# Patient Record
Sex: Male | Born: 1960 | Race: White | Hispanic: No | Marital: Married | Smoking: Never smoker
Health system: Southern US, Community
[De-identification: ages and names within clinical notes are randomized; demographics above are authoritative.]

## PROBLEM LIST (undated history)

## (undated) DIAGNOSIS — E785 Hyperlipidemia, unspecified: Secondary | ICD-10-CM

## (undated) DIAGNOSIS — T7840XA Allergy, unspecified, initial encounter: Secondary | ICD-10-CM

## (undated) DIAGNOSIS — K649 Unspecified hemorrhoids: Secondary | ICD-10-CM

## (undated) DIAGNOSIS — K219 Gastro-esophageal reflux disease without esophagitis: Secondary | ICD-10-CM

## (undated) DIAGNOSIS — R569 Unspecified convulsions: Secondary | ICD-10-CM

## (undated) DIAGNOSIS — E349 Endocrine disorder, unspecified: Secondary | ICD-10-CM

## (undated) DIAGNOSIS — R112 Nausea with vomiting, unspecified: Secondary | ICD-10-CM

## (undated) DIAGNOSIS — Z9889 Other specified postprocedural states: Secondary | ICD-10-CM

## (undated) HISTORY — PX: MANDIBLE SURGERY: SHX707

## (undated) HISTORY — PX: VASECTOMY: SHX75

## (undated) HISTORY — PX: INGUINAL HERNIA REPAIR: SUR1180

## (undated) HISTORY — PX: BACK SURGERY: SHX140

## (undated) HISTORY — PX: COLONOSCOPY: SHX174

## (undated) HISTORY — DX: Gastro-esophageal reflux disease without esophagitis: K21.9

## (undated) HISTORY — DX: Other specified postprocedural states: Z98.890

## (undated) HISTORY — DX: Unspecified hemorrhoids: K64.9

## (undated) HISTORY — DX: Hyperlipidemia, unspecified: E78.5

## (undated) HISTORY — DX: Other specified postprocedural states: R11.2

## (undated) HISTORY — PX: UMBILICAL HERNIA REPAIR: SHX196

## (undated) HISTORY — DX: Unspecified convulsions: R56.9

## (undated) HISTORY — DX: Allergy, unspecified, initial encounter: T78.40XA

## (undated) HISTORY — PX: OTHER SURGICAL HISTORY: SHX169

## (undated) HISTORY — DX: Endocrine disorder, unspecified: E34.9

## (undated) HISTORY — PX: ANKLE SURGERY: SHX546

---

## 2019-12-19 ENCOUNTER — Other Ambulatory Visit: Payer: Self-pay

## 2019-12-20 ENCOUNTER — Ambulatory Visit (INDEPENDENT_AMBULATORY_CARE_PROVIDER_SITE_OTHER): Payer: PRIVATE HEALTH INSURANCE | Admitting: Family Medicine

## 2019-12-20 ENCOUNTER — Encounter: Payer: Self-pay | Admitting: Gastroenterology

## 2019-12-20 ENCOUNTER — Other Ambulatory Visit: Payer: Self-pay

## 2019-12-20 ENCOUNTER — Encounter: Payer: Self-pay | Admitting: Family Medicine

## 2019-12-20 VITALS — BP 110/68 | HR 87 | Temp 95.2°F | Ht 69.0 in | Wt 178.0 lb

## 2019-12-20 DIAGNOSIS — Z114 Encounter for screening for human immunodeficiency virus [HIV]: Secondary | ICD-10-CM

## 2019-12-20 DIAGNOSIS — E291 Testicular hypofunction: Secondary | ICD-10-CM | POA: Diagnosis not present

## 2019-12-20 DIAGNOSIS — E7800 Pure hypercholesterolemia, unspecified: Secondary | ICD-10-CM | POA: Diagnosis not present

## 2019-12-20 DIAGNOSIS — J302 Other seasonal allergic rhinitis: Secondary | ICD-10-CM | POA: Insufficient documentation

## 2019-12-20 DIAGNOSIS — Z1159 Encounter for screening for other viral diseases: Secondary | ICD-10-CM

## 2019-12-20 DIAGNOSIS — R569 Unspecified convulsions: Secondary | ICD-10-CM

## 2019-12-20 DIAGNOSIS — Z1211 Encounter for screening for malignant neoplasm of colon: Secondary | ICD-10-CM

## 2019-12-20 DIAGNOSIS — M25561 Pain in right knee: Secondary | ICD-10-CM

## 2019-12-20 LAB — COMPREHENSIVE METABOLIC PANEL
ALT: 15 U/L (ref 0–53)
AST: 14 U/L (ref 0–37)
Albumin: 4.6 g/dL (ref 3.5–5.2)
Alkaline Phosphatase: 52 U/L (ref 39–117)
BUN: 16 mg/dL (ref 6–23)
CO2: 24 mEq/L (ref 19–32)
Calcium: 9.3 mg/dL (ref 8.4–10.5)
Chloride: 109 mEq/L (ref 96–112)
Creatinine, Ser: 1.37 mg/dL (ref 0.40–1.50)
GFR: 52.98 mL/min — ABNORMAL LOW (ref >60.00)
Glucose, Bld: 98 mg/dL (ref 70–99)
Potassium: 4.1 mEq/L (ref 3.5–5.1)
Sodium: 140 mEq/L (ref 135–145)
Total Bilirubin: 0.5 mg/dL (ref 0.2–1.2)
Total Protein: 6.3 g/dL (ref 6.0–8.3)

## 2019-12-20 LAB — LIPID PANEL
Cholesterol: 199 mg/dL (ref 0–200)
HDL: 39.9 mg/dL (ref >39.00)
LDL Cholesterol: 140 mg/dL — ABNORMAL HIGH (ref 0–99)
NonHDL: 158.63
Total CHOL/HDL Ratio: 5
Triglycerides: 91 mg/dL (ref 0.0–149.0)
VLDL: 18.2 mg/dL (ref 0.0–40.0)

## 2019-12-20 MED ORDER — CLOTRIMAZOLE-BETAMETHASONE 1-0.05 % EX CREA: 1.0000 "application " | TOPICAL_CREAM | Freq: Two times a day (BID) | CUTANEOUS | 2 refills | Status: DC

## 2019-12-20 MED ORDER — SIMVASTATIN 20 MG PO TABS: 20.0000 mg | ORAL_TABLET | Freq: Every day | ORAL | 3 refills | Status: DC

## 2019-12-20 MED ORDER — MONTELUKAST SODIUM 10 MG PO TABS: 10.0000 mg | ORAL_TABLET | Freq: Every day | ORAL | 3 refills | Status: DC

## 2019-12-20 NOTE — Patient Instructions (Addendum)
If you do not hear anything about your referrals in the next 1-2 weeks, call our office and ask for an update.  Give [place] 2-3 business days to get the results of your labs back.   Keep the diet clean and stay active.  Let us know if you need anything.  OK to run and hike. Give pickle ball a few more weeks.  Knee Exercises It is normal to feel mild stretching, pulling, tightness, or discomfort as you do these exercises, but you should stop right away if you feel sudden pain or your pain gets worse. STRETCHING AND RANGE OF MOTION EXERCISES  These exercises warm up your muscles and joints and improve the movement and flexibility of your knee. These exercises also help to relieve pain, numbness, and tingling. Exercise A: Knee Extension, Prone  1. Lie on your abdomen on a bed. 2. Place your left / right knee just beyond the edge of the surface so your knee is not on the bed. You can put a towel under your left / right thigh just above your knee for comfort. 3. Relax your leg muscles and allow gravity to straighten your knee. You should feel a stretch behind your left / right knee. 4. Hold this position for 30 seconds. 5. Scoot up so your knee is supported between repetitions. Repeat 2 times. Complete this stretch 3 times per week. Exercise B: Knee Flexion, Active    1. Lie on your back with both knees straight. If this causes back discomfort, bend your left / right knee so your foot is flat on the floor. 2. Slowly slide your left / right heel back toward your buttocks until you feel a gentle stretch in the front of your knee or thigh. 3. Hold this position for 30 seconds. 4. Slowly slide your left / right heel back to the starting position. Repeat 2 times. Complete this exercise 3 times per week. Exercise C: Quadriceps, Prone    1. Lie on your abdomen on a firm surface, such as a bed or padded floor. 2. Bend your left / right knee and hold your ankle. If you cannot reach your ankle or  pant leg, loop a belt around your foot and grab the belt instead. 3. Gently pull your heel toward your buttocks. Your knee should not slide out to the side. You should feel a stretch in the front of your thigh and knee. 4. Hold this position for 30 seconds. Repeat 2 times. Complete this stretch 3 times per week. Exercise D: Hamstring, Supine  1. Lie on your back. 2. Loop a belt or towel over the ball of your left / right foot. The ball of your foot is on the walking surface, right under your toes. 3. Straighten your left / right knee and slowly pull on the belt to raise your leg until you feel a gentle stretch behind your knee. ? Do not let your left / right knee bend while you do this. ? Keep your other leg flat on the floor. 4. Hold this position for 30 seconds. Repeat 2 times. Complete this stretch 3 times per week. STRENGTHENING EXERCISES  These exercises build strength and endurance in your knee. Endurance is the ability to use your muscles for a long time, even after they get tired. Exercise E: Quadriceps, Isometric    1. Lie on your back with your left / right leg extended and your other knee bent. Put a rolled towel or small pillow under your knee  if told by your health care provider. 2. Slowly tense the muscles in the front of your left / right thigh. You should see your kneecap slide up toward your hip or see increased dimpling just above the knee. This motion will push the back of the knee toward the floor. 3. For 3 seconds, keep the muscle as tight as you can without increasing your pain. 4. Relax the muscles slowly and completely. Repeat for 10 total reps Repeat 2 ti mes. Complete this exercise 3 times per week. Exercise F: Straight Leg Raises - Quadriceps  1. Lie on your back with your left / right leg extended and your other knee bent. 2. Tense the muscles in the front of your left / right thigh. You should see your kneecap slide up or see increased dimpling just above the  knee. Your thigh may even shake a bit. 3. Keep these muscles tight as you raise your leg 4-6 inches (10-15 cm) off the floor. Do not let your knee bend. 4. Hold this position for 3 seconds. 5. Keep these muscles tense as you lower your leg. 6. Relax your muscles slowly and completely after each repetition. 10 total reps. Repeat 2 times. Complete this exercise 3 times per week.  Exercise G: Hamstring Curls    If told by your health care provider, do this exercise while wearing ankle weights. Begin with 5 lb weights (optional). Then increase the weight by 1 lb (0.5 kg) increments. Do not wear ankle weights that are more than 20 lbs to start with. 1. Lie on your abdomen with your legs straight. 2. Bend your left / right knee as far as you can without feeling pain. Keep your hips flat against the floor. 3. Hold this position for 3 seconds. 4. Slowly lower your leg to the starting position. Repeat for 10 reps.  Repeat 2 times. Complete this exercise 3 times per week. Exercise H: Squats (Quadriceps)  1. Stand in front of a table, with your feet and knees pointing straight ahead. You may rest your hands on the table for balance but not for support. 2. Slowly bend your knees and lower your hips like you are going to sit in a chair. ? Keep your weight over your heels, not over your toes. ? Keep your lower legs upright so they are parallel with the table legs. ? Do not let your hips go lower than your knees. ? Do not bend lower than told by your health care provider. ? If your knee pain increases, do not bend as low. 3. Hold the squat position for 1 second. 4. Slowly push with your legs to return to standing. Do not use your hands to pull yourself to standing. Repeat 2 times. Complete this exercise 3 times per week. Exercise I: Wall Slides (Quadriceps)    1. Lean your back against a smooth wall or door while you walk your feet out 18-24 inches (46-61 cm) from it. 2. Place your feet hip-width  apart. 3. Slowly slide down the wall or door until your knees Repeat 2 times. Complete this exercise every other day. 4. Exercise K: Straight Leg Raises - Hip Abductors  1. Lie on your side with your left / right leg in the top position. Lie so your head, shoulder, knee, and hip line up. You may bend your bottom knee to help you keep your balance. 2. Roll your hips slightly forward so your hips are stacked directly over each other and your left /  right knee is facing forward. 3. Leading with your heel, lift your top leg 4-6 inches (10-15 cm). You should feel the muscles in your outer hip lifting. ? Do not let your foot drift forward. ? Do not let your knee roll toward the ceiling. 4. Hold this position for 3 seconds. 5. Slowly return your leg to the starting position. 6. Let your muscles relax completely after each repetition. 10 total reps. Repeat 2 times. Complete this exercise 3 times per week. Exercise J: Straight Leg Raises - Hip Extensors  1. Lie on your abdomen on a firm surface. You can put a pillow under your hips if that is more comfortable. 2. Tense the muscles in your buttocks and lift your left / right leg about 4-6 inches (10-15 cm). Keep your knee straight as you lift your leg. 3. Hold this position for 3 seconds. 4. Slowly lower your leg to the starting position. 5. Let your leg relax completely after each repetition. Repeat 2 times. Complete this exercise 3 times per week. Document Released: 07/22/2005 Document Revised: 06/01/2016 Document Reviewed: 07/14/2015 Elsevier Interactive Patient Education  2017 [employer].

## 2019-12-20 NOTE — Progress Notes (Signed)
Chief Complaint  Patient presents with  . New Patient (Initial Visit)       New Patient Visit SUBJECTIVE: HPI: Stanley Freeman is an 59 y.o.male who is being seen for establishing care.  The patient was previously seen in [place].  Patient has a history of seizures.  He had 2 grand mal seizures when he was younger.  He has had a series of petit mall seizures in the decades following.  He has not had a seizure in the past 15 years.  His neurologist in [place] suggested he try coming off the medication but he would prefer to wait until he is retired to do this.  He is currently taking Lamictal and Topamax.  He is compliant with his medication and tolerating well.  Patient has a history of hypergonadism.  He has been on testosterone replacement for the past 10 years.  He was under the care of a urologist and was requesting a referral to one.  The patient has a history of dyslipidemia.  He does have a family history in both of his parents for this.  He is taking Zocor 20 mg daily.  He is tolerating this well and is not having any side effects.  Diet is healthy overall.  Exercise includes hiking, walking, pickleball, and cycling.  Patient hurt his right knee almost 7 weeks ago while playing pickle ball.  He denies hearing any snaps or pops.  No swelling, redness, or bruising.  The knee does not catch or lock.  It does not feel unsteady.  Past Medical History:  Diagnosis Date  . Allergy   . Hyperlipidemia   . Seizures ([place])   . Testosterone deficiency    History reviewed. No pertinent surgical history. Family History  Problem Relation Age of Onset  . Dementia Father    No Known Allergies  Current Outpatient Medications:  .  cetirizine (ZYRTEC) 10 MG tablet, Take 10 mg by mouth daily., Disp: , Rfl:  .  fluticasone (FLONASE) 50 MCG/ACT nasal spray, Place into both nostrils daily., Disp: , Rfl:  .  lamoTRIgine (LAMICTAL) 200 MG tablet, Take 200 mg by mouth 2 (two) times daily., Disp: , Rfl:  .   montelukast (SINGULAIR) 10 MG tablet, Take 1 tablet (10 mg total) by mouth at bedtime., Disp: 90 tablet, Rfl: 3 .  simvastatin (ZOCOR) 20 MG tablet, Take 1 tablet (20 mg total) by mouth daily., Disp: 90 tablet, Rfl: 3 .  topiramate (TOPAMAX) 100 MG tablet, Take 100 mg by mouth 2 (two) times daily., Disp: , Rfl:  .  clotrimazole-betamethasone (LOTRISONE) cream, Apply 1 application topically 2 (two) times daily., Disp: 30 g, Rfl: 2   OBJECTIVE: BP 110/68 (BP Location: Left Arm, Patient Position: Sitting, Cuff Size: Normal)   Pulse 87   Temp (!) 95.2 F (35.1 C) (Temporal)   Ht 5\' 9"  (1.753 m)   Wt 178 lb (80.7 kg)   SpO2 97%   BMI 26.29 kg/m  General:  well developed, well nourished, in no apparent distress Skin:  no significant moles, warts, or growths Throat/Pharynx:  lips and gingiva without lesion; tongue and uvula midline; non-inflamed pharynx; no exudates or postnasal drainage Lungs:  clear to auscultation, breath sounds equal bilaterally, no respiratory distress Cardio:  regular rate and rhythm, no LE edema or bruits Musculoskeletal: Right knee-mild joint line tenderness medially; no other joint line tenderness or bony tenderness.  Negative patellar apprehension/grind, Lachman's, varus/valgus stress, McMurray's, and Apley grind Neuro:  gait normal Psych: well oriented  with normal range of affect and appropriate judgment/insight  ASSESSMENT/PLAN: Pure hypercholesterolemia - Plan: simvastatin (ZOCOR) 20 MG tablet, Lipid panel, Comprehensive metabolic panel  Hypogonadism male - Plan: Ambulatory referral to Urology  Seasonal allergies - Plan: cetirizine (ZYRTEC) 10 MG tablet, fluticasone (FLONASE) 50 MCG/ACT nasal spray, montelukast (SINGULAIR) 10 MG tablet  Seizures ([facility]) - Plan: topiramate (TOPAMAX) 100 MG tablet, lamoTRIgine (LAMICTAL) 200 MG tablet, Ambulatory referral to Neurology  Screen for colon cancer - Plan: Ambulatory referral to Gastroenterology  Screening for HIV  (human immunodeficiency virus) - Plan: HIV Antibody (routine testing w rflx)  Acute pain of right knee  Encounter for hepatitis C screening test for low risk patient - Plan: Hepatitis C antibody  1-check labs.  Counseled on diet and exercise.  With him, likely more genetic as opposed to poor lifestyle choices. 2-refer to urology 3-continue Zyrtec, Flonase, and Singulair. 4-continue Topamax and Lamictal.  Refer to neurology. 5-due for colon cancer screening, last was 10 years ago. 6-screening today 7-stretches and exercises given.  I doubt he tore his MCL given normal valgus stress response.  If he did injure his meniscus, I think it is mild given his testing.  Activity as tolerated.  Would hold off on pickleball or any activity that involves planting or shifting weight rapidly for the next few weeks. 8-screening today Patient should return 6 months for a physical or as needed. The patient voiced understanding and agreement to the plan.   [place], DO 12/20/19  8:24 AM

## 2019-12-21 LAB — HEPATITIS C ANTIBODY
Hepatitis C Ab: NONREACTIVE
SIGNAL TO CUT-OFF: 0.01 (ref <1.00)

## 2019-12-21 LAB — HIV ANTIBODY (ROUTINE TESTING W REFLEX): HIV 1&2 Ab, 4th Generation: NONREACTIVE

## 2019-12-25 ENCOUNTER — Other Ambulatory Visit: Payer: Self-pay | Admitting: Family Medicine

## 2019-12-25 DIAGNOSIS — E7800 Pure hypercholesterolemia, unspecified: Secondary | ICD-10-CM

## 2019-12-25 MED ORDER — ROSUVASTATIN CALCIUM 20 MG PO TABS: 20.0000 mg | ORAL_TABLET | Freq: Every day | ORAL | 3 refills | Status: DC

## 2019-12-25 NOTE — Progress Notes (Signed)
lipi

## 2019-12-27 ENCOUNTER — Telehealth: Payer: Self-pay | Admitting: Neurology

## 2019-12-27 NOTE — Telephone Encounter (Signed)
I reviewed his neurological clinic evaluation from [facility] dated December 26, 2018 by Dr. 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

## 2020-01-19 ENCOUNTER — Other Ambulatory Visit: Payer: Self-pay

## 2020-01-19 ENCOUNTER — Ambulatory Visit (AMBULATORY_SURGERY_CENTER): Payer: Self-pay | Admitting: *Deleted

## 2020-01-19 VITALS — Temp 96.8°F | Ht 69.0 in | Wt 180.0 lb

## 2020-01-19 DIAGNOSIS — Z1211 Encounter for screening for malignant neoplasm of colon: Secondary | ICD-10-CM

## 2020-01-19 NOTE — Progress Notes (Signed)
COMPLETED COVID VACCINES 12-25-19   No egg or soy allergy known to patient  Nissues with past sedation with any surgeries  or procedures OF PONV , no intubation problems  No diet pills per patient No home 02 use per patient  No blood thinners per patient  Pt denies issues with constipation  No A fib or A flutter  EMMI video sent to pt's e mail  PER PT LAST SEIZURE 2005- ON LAMICTAL AND TOPAMAX   Due to the COVID-19 pandemic we are asking patients to follow these guidelines. Please only bring one care partner. Please be aware that your care partner may wait in the car in the parking lot or if they feel like they will be too hot to wait in the car, they may wait in the lobby on the 4th floor. All care partners are required to wear a mask the entire time (we do not have any that we can provide them), they need to practice social distancing, and we will do a Covid check for all patient's and care partners when you arrive. Also we will check their temperature and your temperature. If the care partner waits in their car they need to stay in the parking lot the entire time and we will call them on their cell phone when the patient is ready for discharge so they can bring the car to the front of the building. Also all patient's will need to wear a mask into building.

## 2020-01-29 ENCOUNTER — Ambulatory Visit (INDEPENDENT_AMBULATORY_CARE_PROVIDER_SITE_OTHER): Payer: PRIVATE HEALTH INSURANCE | Admitting: Neurology

## 2020-01-29 ENCOUNTER — Encounter: Payer: Self-pay | Admitting: Neurology

## 2020-01-29 ENCOUNTER — Other Ambulatory Visit: Payer: Self-pay

## 2020-01-29 VITALS — BP 132/82 | HR 66 | Ht 69.0 in | Wt 177.0 lb

## 2020-01-29 DIAGNOSIS — R569 Unspecified convulsions: Secondary | ICD-10-CM | POA: Diagnosis not present

## 2020-01-29 DIAGNOSIS — G40301 Generalized idiopathic epilepsy and epileptic syndromes, not intractable, with status epilepticus: Secondary | ICD-10-CM

## 2020-01-29 MED ORDER — TOPIRAMATE 100 MG PO TABS: 100.0000 mg | ORAL_TABLET | Freq: Two times a day (BID) | ORAL | 4 refills | Status: DC

## 2020-01-29 MED ORDER — LAMOTRIGINE 200 MG PO TABS: 200.0000 mg | ORAL_TABLET | Freq: Two times a day (BID) | ORAL | 4 refills | Status: DC

## 2020-01-29 NOTE — Progress Notes (Signed)
PATIENT: Stanley Freeman DOB: 08-24-1961  Chief Complaint  Patient presents with  . New Patient (Initial Visit)    PCP/Referring: Dr. Riki Sheer. Vision: 20/40 with correction bilaterally.  . Seizures    Grand mal seizures in his 30s. Some petite seizures in his 83s and 67s. Doing well on lamictal and topamax. Will need refills.     HISTORICAL  Stanley Freeman is a 59 year old male, seen in request by his primary care physician Dr. Nani Ravens, [place], for evaluation of seizure, initial evaluation was on Jan 29, 2020.  I have reviewed and summarized the referring note from the referring physician.  He had a past medical history of hypertension, hyperlipidemia, reported history of seizures since 1984.  He suffered his first nocturnal generalized seizure at age 50 in 34, had his second witnessed nocturnal seizure in 1985, reported normal MRI of the brain, EEG at that time, he was treated with Dilantin, since Dilantin treatment, he no longer have generalized seizure  However, he continue complains of intermittent morning time dj vu, he remembered while he was shaving, he has this waves of weird sensation throughout his body, last less than 1 minute, mild confusion afterwards, there was no tonic-clonic shaking, he was followed by his primary care physician during that period of time, around his 30s, stayed on Dilantin.  He had recurrent partial seizure, about once or twice every months.  He began to seek attention from neurologist after he settled in [place] in 2001, was seen by a neurologist Dr. 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  Laboratory evaluation April 2021, normal CMP, lipid panel, LDL 140, negative HIV, hepatitis C   REVIEW OF SYSTEMS: Full 14 system review of systems performed and notable only for as above All other review of systems were negative.  ALLERGIES: No Known Allergies  HOME MEDICATIONS: Current Outpatient Medications  Medication Sig Dispense Refill  . cetirizine (ZYRTEC) 10 MG tablet Take 10 mg by mouth daily.    . clotrimazole-betamethasone (LOTRISONE) cream Apply 1 application topically 2 (two) times daily. 30 g 2  . fluticasone (FLONASE) 50 MCG/ACT nasal spray Place into both nostrils daily.    Marland Kitchen lamoTRIgine (LAMICTAL) 200 MG tablet Take 200 mg by mouth 2 (two) times daily.    . montelukast (SINGULAIR) 10 MG tablet Take 1 tablet (10 mg total) by mouth at bedtime. 90 tablet 3  . rosuvastatin (CRESTOR) 20 MG tablet Take 1 tablet (20 mg total) by mouth daily. 30 tablet 3  . testosterone (ANDROGEL) 50 MG/5GM (1%) GEL Place 5 g onto the skin daily. 3 PUMPS A DAY    . topiramate (TOPAMAX) 100 MG tablet Take 100 mg by mouth 2 (two) times daily.     No current facility-administered medications for this visit.    PAST MEDICAL HISTORY: Past Medical History:  Diagnosis Date  . Allergy   .  GERD (gastroesophageal reflux disease)    SEV YRS AGO- OCC HEARTBURM- PRN OTC USE   . Hemorrhoids   . Hyperlipidemia   . PONV (postoperative nausea and vomiting)   . Seizures ([place])    ON TOPAMAX AND LAMICTAL- LAST 2005 SEIZURES- GRAND MALX 2 IN EARLY 20'S   . Testosterone deficiency     PAST SURGICAL HISTORY: Past Surgical History:  Procedure Laterality Date  . ANKLE SURGERY    . BACK SURGERY       DISC  . COLONOSCOPY     IN [place] 10 YRS AGO- NORMAL PER PT   . HEMORROID BANDING     X 2 WITH HELP PER PT   . INGUINAL HERNIA REPAIR    . MANDIBLE SURGERY    . UMBILICAL HERNIA REPAIR    . VASECTOMY      FAMILY HISTORY: Family History  Problem Relation Age of Onset  . Diverticulitis Mother   . Colon polyps Mother   . Dementia Father   . Colon cancer Neg Hx   . Esophageal cancer Neg Hx   . Rectal cancer Neg Hx   . Stomach cancer Neg Hx     SOCIAL HISTORY: Social History   Socioeconomic History  . Marital status: Married    Spouse name: Not on file  . Number of children: Not on file  . Years of education: Not on file  . Highest education level: Not on file  Occupational History  . Not on file  Tobacco Use  . Smoking status: Never Smoker  . Smokeless tobacco: Never Used  Substance and Sexual Activity  . Alcohol use: Yes    Comment: SOCIAL 1-2 DRINKS A FEW DAYS A WEEK   . Drug use: Never  . Sexual activity: Not on file  Other Topics Concern  . Not on file  Social History Narrative  . Not on file   Social Determinants of Health   Financial Resource Strain:   . Difficulty of Paying Living Expenses:   Food Insecurity:   . Worried About Charity fundraiser in the Last Year:   . Arboriculturist in the Last Year:   Transportation Needs:   . Film/video editor (Medical):   Marland Kitchen Lack of Transportation (Non-Medical):   Physical Activity:   . Days of Exercise per Week:   . Minutes of Exercise per Session:   Stress:   . Feeling of Stress :   Social Connections:   . Frequency of Communication with Friends and Family:   . Frequency of Social Gatherings with Friends and Family:   . Attends Religious Services:   . Active Member of Clubs or Organizations:   . Attends Archivist Meetings:   Marland Kitchen Marital Status:   Intimate Partner Violence:   . Fear of Current or Ex-Partner:   . Emotionally Abused:   Marland Kitchen Physically Abused:   . Sexually Abused:       PHYSICAL EXAM   Vitals:   01/29/20 1512  BP: 132/82  Pulse: 66  Weight: 177 lb (80.3 kg)  Height: 5\' 9"  (1.753 m)    Not recorded      Body mass index is 26.14 kg/m.  PHYSICAL EXAMNIATION:  Gen: NAD, conversant, well nourised, well groomed                     Cardiovascular: Regular rate rhythm, no peripheral edema, warm, nontender. Eyes: Conjunctivae clear without exudates or hemorrhage Neck: Supple, no  carotid bruits. Pulmonary: Clear to auscultation bilaterally   NEUROLOGICAL EXAM:  MENTAL STATUS: Speech:    Speech is normal; fluent and spontaneous with normal comprehension.  Cognition:     Orientation to time, place and person     Normal recent and remote memory     Normal Attention span and concentration     Normal Language, naming, repeating,spontaneous speech     Fund of knowledge   CRANIAL NERVES: CN II: Visual fields are full to confrontation. Pupils are round equal and briskly reactive to light. CN III, IV, VI: extraocular movement are normal. No ptosis. CN V: Facial sensation is intact to light touch CN VII: Face is symmetric with normal eye closure  CN VIII: Hearing is normal to causal conversation. CN IX, X: Phonation is normal. CN XI: Head turning and shoulder shrug are intact  MOTOR: There is no pronator drift of out-stretched arms. Muscle bulk and tone are normal. Muscle strength is normal.  REFLEXES: Reflexes are 2+ and symmetric at the biceps, triceps, knees, and ankles. Plantar responses are flexor.  SENSORY: Intact to light touch, pinprick and vibratory sensation are intact in fingers and toes.  COORDINATION: There is no trunk or limb dysmetria noted.  GAIT/STANCE: Posture is normal. Gait is steady with normal steps, base, arm swing, and turning. Heel and toe walking are normal. Tandem gait is normal.  Romberg is absent.   DIAGNOSTIC DATA (LABS, IMAGING, TESTING) - I reviewed patient records, labs, notes, testing and imaging  myself where available.   ASSESSMENT AND PLAN  Stanley Freeman is a 59 y.o. male   Complex partial seizure  Reported normal MRI of the brain, EEG  Refill his lamotrigine 200 twice a day, Topamax 100 mg twice a day   Marcial Pacas, M.D. Ph.D.  [facility] [address] [place], [place] reviewed his neurological clinic evaluation from [facility] dated December 26, 2018 by Dr. 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

## 2020-01-30 ENCOUNTER — Encounter: Payer: Self-pay | Admitting: Gastroenterology

## 2020-02-02 ENCOUNTER — Ambulatory Visit (AMBULATORY_SURGERY_CENTER): Payer: PRIVATE HEALTH INSURANCE | Admitting: Gastroenterology

## 2020-02-02 ENCOUNTER — Other Ambulatory Visit: Payer: Self-pay

## 2020-02-02 ENCOUNTER — Encounter: Payer: Self-pay | Admitting: Gastroenterology

## 2020-02-02 VITALS — BP 101/69 | HR 68 | Temp 97.3°F | Resp 15 | Ht 69.0 in | Wt 180.0 lb

## 2020-02-02 DIAGNOSIS — Z1211 Encounter for screening for malignant neoplasm of colon: Secondary | ICD-10-CM

## 2020-02-02 DIAGNOSIS — K5730 Diverticulosis of large intestine without perforation or abscess without bleeding: Secondary | ICD-10-CM

## 2020-02-02 DIAGNOSIS — D124 Benign neoplasm of descending colon: Secondary | ICD-10-CM

## 2020-02-02 DIAGNOSIS — K641 Second degree hemorrhoids: Secondary | ICD-10-CM

## 2020-02-02 MED ORDER — SODIUM CHLORIDE 0.9 % IV SOLN: 500.0000 mL | INTRAVENOUS | Status: DC

## 2020-02-02 NOTE — Patient Instructions (Signed)
YOU HAD AN ENDOSCOPIC PROCEDURE TODAY AT [facility]:   Refer to the procedure report that was given to you for any specific questions about what was found during the examination.  If the procedure report does not answer your questions, please call your gastroenterologist to clarify.  If you requested that your care partner not be given the details of your procedure findings, then the procedure report has been included in a sealed envelope for you to review at your convenience later.  YOU SHOULD EXPECT: Some feelings of bloating in the abdomen. Passage of more gas than usual.  Walking can help get rid of the air that was put into your GI tract during the procedure and reduce the bloating. If you had a lower endoscopy (such as a colonoscopy or flexible sigmoidoscopy) you may notice spotting of blood in your stool or on the toilet paper. If you underwent a bowel prep for your procedure, you may not have a normal bowel movement for a few days.  Please Note:  You might notice some irritation and congestion in your nose or some drainage.  This is from the oxygen used during your procedure.  There is no need for concern and it should clear up in a day or so.  SYMPTOMS TO REPORT IMMEDIATELY:   Following lower endoscopy (colonoscopy or flexible sigmoidoscopy):  Excessive amounts of blood in the stool  Significant tenderness or worsening of abdominal pains  Swelling of the abdomen that is new, acute  Fever of 100F or higher    For urgent or emergent issues, a gastroenterologist can be reached at any hour by calling [phone]. Do not use MyChart messaging for urgent concerns.    DIET:  We do recommend a small meal at first, but then you may proceed to your regular diet.  Drink plenty of fluids but you should avoid alcoholic beverages for 24 hours.  ACTIVITY:  You should plan to take it easy for the rest of today and you should NOT DRIVE or use heavy machinery until tomorrow  (because of the sedation medicines used during the test).    FOLLOW UP: Our staff will call the number listed on your records 48-72 hours following your procedure to check on you and address any questions or concerns that you may have regarding the information given to you following your procedure. If we do not reach you, we will leave a message.  We will attempt to reach you two times.  During this call, we will ask if you have developed any symptoms of COVID 19. If you develop any symptoms (ie: fever, flu-like symptoms, shortness of breath, cough etc.) before then, please call [phone].  If you test positive for Covid 19 in the 2 weeks post procedure, please call and report this information to [place].    If any biopsies were taken you will be contacted by phone or by letter within the next 1-3 weeks.  Please call us at [phone] if you have not heard about the biopsies in 3 weeks.    SIGNATURES/CONFIDENTIALITY: You and/or your care partner have signed paperwork which will be entered into your electronic medical record.  These signatures attest to the fact that that the information above on your After Visit Summary has been reviewed and is understood.  Full responsibility of the confidentiality of this discharge information lies with you and/or your care-partner.   Resume medications,see report for fiber recommendations. Information given on polyps,diverticulosis and hemorrhoids.

## 2020-02-02 NOTE — Op Note (Addendum)
[facility] Patient Name: Stanley Freeman Procedure Date: 02/02/2020 8:58 AM MRN: [id number] Endoscopist: Gerrit Heck , MD Age: 59 Referring MD:  Date of Birth: October 02, 1960 Gender: Male Account #: [id number] Procedure:                Colonoscopy Indications:              Screening for colorectal malignant neoplasm (last                            colonoscopy was 10 years ago) Medicines:                Monitored Anesthesia Care Procedure:                Pre-Anesthesia Assessment:                           - Prior to the procedure, a History and Physical                            was performed, and patient medications and                            allergies were reviewed. The patient's tolerance of                            previous anesthesia was also reviewed. The risks                            and benefits of the procedure and the sedation                            options and risks were discussed with the patient.                            All questions were answered, and informed consent                            was obtained. Prior Anticoagulants: The patient has                            taken no previous anticoagulant or antiplatelet                            agents. ASA Grade Assessment: II - A patient with                            mild systemic disease. After reviewing the risks                            and benefits, the patient was deemed in                            satisfactory condition to undergo the procedure.  After obtaining informed consent, the colonoscope                            was passed under direct vision. Throughout the                            procedure, the patient's blood pressure, pulse, and                            oxygen saturations were monitored continuously. The                            Colonoscope was introduced through the anus and                            advanced to the the terminal  ileum. The colonoscopy                            was performed without difficulty. The patient                            tolerated the procedure well. The quality of the                            bowel preparation was excellent. The terminal                            ileum, ileocecal valve, appendiceal orifice, and                            rectum were photographed. Scope In: 9:15:01 AM Scope Out: 9:33:59 AM Scope Withdrawal Time: 0 hours 13 minutes 27 seconds  Total Procedure Duration: 0 hours 18 minutes 58 seconds  Findings:                 Hemorrhoids were found on perianal exam.                           A 5 mm polyp was found in the descending colon. The                            polyp was sessile. The polyp was removed with a                            cold snare. Resection and retrieval were complete.                            Estimated blood loss was minimal.                           Multiple small-mouthed diverticula were found in                            the sigmoid colon.  Non-bleeding internal hemorrhoids were found during                            retroflexion. The hemorrhoids were medium-sized and                            Grade II (internal hemorrhoids that prolapse but                            reduce spontaneously).                           Three well healed scars consistent with previous                            hemorrhoid surgery/banding were found in the distal                            rectum. The scar tissue was healthy in appearance.                           The terminal ileum appeared normal. Complications:            No immediate complications. Estimated Blood Loss:     Estimated blood loss was minimal. Impression:               - Hemorrhoids found on perianal exam.                           - One 5 mm polyp in the descending colon, removed                            with a cold snare. Resected and retrieved.                            - Diverticulosis in the sigmoid colon.                           - Non-bleeding internal hemorrhoids.                           - The examined portion of the ileum was normal. Recommendation:           - Patient has a contact number available for                            emergencies. The signs and symptoms of potential                            delayed complications were discussed with the                            patient. Return to normal activities tomorrow.                            Written discharge instructions  were provided to the                            patient.                           - Resume previous diet.                           - Continue present medications.                           - Await pathology results.                           - Repeat colonoscopy in 5-10 years for surveillance                            based on pathology results.                           - Use fiber, for example Citrucel, Fibercon, Konsyl                            or Metamucil.                           - Return to GI clinic PRN. Gerrit Heck, MD 02/02/2020 9:41:18 AM

## 2020-02-02 NOTE — Progress Notes (Signed)
Report to PACU, RN, vss, BBS= Clear.  

## 2020-02-02 NOTE — Progress Notes (Signed)
Temp JB V/a CW  I have reviewed the patient's medical history in detail and updated the computerized patient record.

## 2020-02-02 NOTE — Progress Notes (Signed)
Called to room to assist during endoscopic procedure.  Patient ID and intended procedure confirmed with present staff. Received instructions for my participation in the procedure from the performing physician.  

## 2020-02-05 ENCOUNTER — Other Ambulatory Visit: Payer: Self-pay

## 2020-02-05 ENCOUNTER — Other Ambulatory Visit (INDEPENDENT_AMBULATORY_CARE_PROVIDER_SITE_OTHER): Payer: PRIVATE HEALTH INSURANCE

## 2020-02-05 DIAGNOSIS — E7800 Pure hypercholesterolemia, unspecified: Secondary | ICD-10-CM | POA: Diagnosis not present

## 2020-02-05 LAB — LIPID PANEL
Cholesterol: 164 mg/dL (ref 0–200)
HDL: 36.3 mg/dL — ABNORMAL LOW (ref >39.00)
LDL Cholesterol: 104 mg/dL — ABNORMAL HIGH (ref 0–99)
NonHDL: 127.87
Total CHOL/HDL Ratio: 5
Triglycerides: 121 mg/dL (ref 0.0–149.0)
VLDL: 24.2 mg/dL (ref 0.0–40.0)

## 2020-02-06 ENCOUNTER — Telehealth: Payer: Self-pay

## 2020-02-06 NOTE — Telephone Encounter (Signed)
  Follow up Call-  Call back number 02/02/2020  Post procedure Call Back phone  # [phone]  Permission to leave phone message Yes     Patient questions:  Do you have a fever, pain , or abdominal swelling? No. Pain Score  0 *  Have you tolerated food without any problems? Yes.    Have you been able to return to your normal activities? Yes.    Do you have any questions about your discharge instructions: Diet   No. Medications  No. Follow up visit  No.  Do you have questions or concerns about your Care? No.  Actions: * If pain score is 4 or above: No action needed, pain <4.  1. Have you developed a fever since your procedure? no  2.   Have you had an respiratory symptoms (SOB or cough) since your procedure? no  3.   Have you tested positive for COVID 19 since your procedure no  4.   Have you had any family members/close contacts diagnosed with the COVID 19 since your procedure?  no   If yes to any of these questions please route to Joylene John, RN and Erenest Rasher, RN

## 2020-02-06 NOTE — Telephone Encounter (Signed)
1st follow up call made.  NALM 

## 2020-02-07 MED ORDER — ROSUVASTATIN CALCIUM 20 MG PO TABS: 20.0000 mg | ORAL_TABLET | Freq: Every day | ORAL | 2 refills | Status: DC

## 2020-02-08 ENCOUNTER — Encounter: Payer: Self-pay | Admitting: Hematology & Oncology

## 2020-03-22 ENCOUNTER — Encounter: Payer: Self-pay | Admitting: Family Medicine

## 2020-03-22 ENCOUNTER — Other Ambulatory Visit: Payer: Self-pay

## 2020-03-22 ENCOUNTER — Ambulatory Visit (INDEPENDENT_AMBULATORY_CARE_PROVIDER_SITE_OTHER): Payer: PRIVATE HEALTH INSURANCE | Admitting: Family Medicine

## 2020-03-22 VITALS — BP 104/68 | HR 90 | Temp 98.1°F | Ht 69.0 in | Wt 172.5 lb

## 2020-03-22 DIAGNOSIS — M25561 Pain in right knee: Secondary | ICD-10-CM | POA: Diagnosis not present

## 2020-03-22 DIAGNOSIS — G8929 Other chronic pain: Secondary | ICD-10-CM | POA: Diagnosis not present

## 2020-03-22 NOTE — Progress Notes (Signed)
Musculoskeletal Exam  Patient: Stanley Freeman DOB: 09/26/1960  DOS: 03/22/2020  SUBJECTIVE:  Chief Complaint:   Chief Complaint  Patient presents with   Knee Pain    right    Stanley Freeman is a 59 y.o.  male for evaluation and treatment of R knee pain.   Onset:  5 months ago. Was getting better and with reintroduction of activity, he started having treturn of pain. Location: Medial knee  Character:  throbbing  Progression of issue:  Was getting better and is worsening Associated symptoms: pain more when he moves; no catching, locking, redness, bruising, very minimal swelling if any. ROM OK minus swelling. 5/10 pain.  Treatment: to date has been rest, ice and home exercises.   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 No acute distress. Cardiovascular: Brisk cap refill Thorax & Lungs: No accessory muscle use Musculoskeletal: R knee.   Normal active range of motion: yes.   Normal passive range of motion: yes Tenderness to palpation: yes over antero-medial jt line Deformity: no Ecchymosis: no Tests positive: There was ttp when stressing the medial meniscus on McMurray's, but no clicking Tests negative: Stine's, Lachman's, varus/valgus, pat app/grind Neurologic: Normal sensory function.  Psychiatric: Normal mood. Age appropriate judgment and insight. Alert & oriented x 3.     Assessment:  Chronic pain of right knee - Plan: Ambulatory referral to Sports Medicine  Plan: Cont stretches/exercises, refer sports med for possible imaging. Will hold off on XR as it does not sound like bony involvement for now.  F/u prn. The patient voiced understanding and agreement to the plan.   [place], DO 03/22/20  8:18 AM

## 2020-03-22 NOTE — Patient Instructions (Signed)
If you do not hear anything about your referral in the next week or so, call our office and ask for an update.  I would continue the stretches/exercises in meanwhile.  Ice/cold pack over area for 10-15 min twice daily.  Heat (pad or rice pillow in microwave) over affected area, 10-15 minutes twice daily.   Let us know if you need anything.

## 2020-04-04 ENCOUNTER — Encounter: Payer: Self-pay | Admitting: Family Medicine

## 2020-04-04 ENCOUNTER — Other Ambulatory Visit: Payer: Self-pay

## 2020-04-04 ENCOUNTER — Ambulatory Visit (INDEPENDENT_AMBULATORY_CARE_PROVIDER_SITE_OTHER): Payer: PRIVATE HEALTH INSURANCE | Admitting: Family Medicine

## 2020-04-04 ENCOUNTER — Ambulatory Visit: Payer: Self-pay

## 2020-04-04 VITALS — BP 108/72 | HR 73 | Ht 69.0 in | Wt 167.0 lb

## 2020-04-04 DIAGNOSIS — S83249A Other tear of medial meniscus, current injury, unspecified knee, initial encounter: Secondary | ICD-10-CM | POA: Insufficient documentation

## 2020-04-04 DIAGNOSIS — M25561 Pain in right knee: Secondary | ICD-10-CM

## 2020-04-04 DIAGNOSIS — M23203 Derangement of unspecified medial meniscus due to old tear or injury, right knee: Secondary | ICD-10-CM | POA: Diagnosis not present

## 2020-04-04 MED ORDER — PENNSAID 2 % EX SOLN: 1.0000 "application " | Freq: Two times a day (BID) | CUTANEOUS | 3 refills | Status: DC

## 2020-04-04 NOTE — Patient Instructions (Signed)
Nice to meet you Please try ice  Please try the compression  Please try the pennsaid  Please try the exercises   Please send me a message in MyChart with any questions or updates.  Please see me back in 4 weeks.   --Dr. Raeford Razor

## 2020-04-04 NOTE — Progress Notes (Signed)
Stanley Freeman - 59 y.o. male MRN [id number]  Date of birth: Mar 30, 1961  SUBJECTIVE:  Including CC & ROS.  Chief Complaint  Patient presents with  . Knee Pain    right    Stanley Freeman is a 59 y.o. male that is presenting with right knee pain.  The pain has gotten worse with running.  He feels over the inside aspect of his knee.  Has some stiffness from time to time.  No history of injury or inciting event.  No surgery.  Localized tenderness.   Review of Systems See HPI   HISTORY: Past Medical, Surgical, Social, and Family History Reviewed & Updated per EMR.   Pertinent Historical Findings include:  Past Medical History:  Diagnosis Date  . Allergy   . GERD (gastroesophageal reflux disease)    SEV YRS AGO- OCC HEARTBURM- PRN OTC USE   . Hemorrhoids   . Hyperlipidemia   . PONV (postoperative nausea and vomiting)   . 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 Testosterone deficiency     Past Surgical History:  Procedure Laterality Date  . ANKLE SURGERY    . BACK SURGERY     DISC  . COLONOSCOPY     IN [place] 10 YRS AGO- NORMAL PER PT   . HEMORROID BANDING     X 2 WITH HELP PER PT   . INGUINAL HERNIA REPAIR    . MANDIBLE SURGERY    . UMBILICAL HERNIA REPAIR    . VASECTOMY      Family History  Problem Relation Age of Onset  . Diverticulitis Mother   . Colon polyps Mother   . Dementia Father   . Colon cancer Neg Hx   . Esophageal cancer Neg Hx   . Rectal cancer Neg Hx   . Stomach cancer Neg Hx     Social History   Socioeconomic History  . Marital status: Married    Spouse name: Not on file  . Number of children: Not on file  . Years of education: Not on file  . Highest education level: Not on file  Occupational History  . Not on file  Tobacco Use  . Smoking status: Never Smoker  . Smokeless tobacco: Never Used  Substance and Sexual Activity  . Alcohol use: Yes    Comment: SOCIAL 1-2 DRINKS A FEW DAYS A  WEEK   . Drug use: Never  . Sexual activity: Not on file  Other Topics Concern  . Not on file  Social History Narrative  . Not on file   Social Determinants of Health   Financial Resource Strain:   . Difficulty of Paying Living Expenses:   Food Insecurity:   . Worried About Charity fundraiser in the Last Year:   . Arboriculturist in the Last Year:   Transportation Needs:   . Film/video editor (Medical):   Marland Kitchen Lack of Transportation (Non-Medical):   Physical Activity:   . Days of Exercise per Week:   . Minutes of Exercise per Session:   Stress:   . Feeling of Stress :   Social Connections:   . Frequency of Communication with Friends and Family:   . Frequency of Social Gatherings with Friends and Family:   . Attends Religious Services:   . Active Member of Clubs or Organizations:   . Attends Archivist Meetings:   Marland Kitchen Marital Status:  Intimate Partner Violence:   . Fear of Current or Ex-Partner:   . Emotionally Abused:   Marland Kitchen Physically Abused:   . Sexually Abused:      PHYSICAL EXAM:  VS: BP 108/72   Pulse 73   Ht 5\' 9"  (1.753 m)   Wt 167 lb (75.8 kg)   BMI 24.66 kg/m  Physical Exam Gen: NAD, alert, cooperative with exam, well-appearing MSK:  Right leg: No obvious effusion. Normal. Normal strength resistance. No tenderness palpation of the medial aspect. Neurovascularly intact  Limited ultrasound: Right knee:  Mild effusion. Normal-appearing quadricep patellar tendon. Normal-appearing medial joint space. Mild degenerative changes of meniscus.   Summary: Mild degenerative change of the meniscus.  Ultrasound and interpretation by Clearance Coots, MD   ASSESSMENT & PLAN:   Degenerative tear of medial meniscus of right knee Symptoms seem more related to degenerative changes of meniscus.  Has some weakness with hip abduction and tends to fire the tensor fascia more compared to the other to hip abductors.  Does a lot of cycling. -Counseled on  home exercise therapy and supportive care. -Pennsaid and samples provided. -Counseled on compression. -Could consider imaging, injection and physical therapy.

## 2020-04-04 NOTE — Progress Notes (Signed)
Medication Samples have been provided to the patient.  Drug name: Pennsaid       Strength: 2%        Qty: 1 box  LOT: [id number]  Exp.Date: 10/2020  Dosing instructions: use a pea size amount and rub gently.  The patient has been instructed regarding the correct time, dose, and frequency of taking this medication, including desired effects and most common side effects.   Sherrie George, MA 9:35 AM 04/04/2020

## 2020-04-04 NOTE — Assessment & Plan Note (Signed)
Symptoms seem more related to degenerative changes of meniscus.  Has some weakness with hip abduction and tends to fire the tensor fascia more compared to the other to hip abductors.  Does a lot of cycling. -Counseled on home exercise therapy and supportive care. -Pennsaid and samples provided. -Counseled on compression. -Could consider imaging, injection and physical therapy.

## 2020-05-02 ENCOUNTER — Other Ambulatory Visit: Payer: Self-pay

## 2020-05-02 ENCOUNTER — Ambulatory Visit (INDEPENDENT_AMBULATORY_CARE_PROVIDER_SITE_OTHER): Payer: PRIVATE HEALTH INSURANCE | Admitting: Family Medicine

## 2020-05-02 DIAGNOSIS — M23203 Derangement of unspecified medial meniscus due to old tear or injury, right knee: Secondary | ICD-10-CM

## 2020-05-02 MED ORDER — IBUPROFEN-FAMOTIDINE 800-26.6 MG PO TABS: 1.0000 | ORAL_TABLET | Freq: Three times a day (TID) | ORAL | 3 refills | Status: DC | PRN

## 2020-05-02 NOTE — Progress Notes (Signed)
Medication Samples have been provided to the patient.  Drug name: duexis       Strength: 800/26.6mg         Qty: 2 boxes  LOT: [id number]  Exp.Date: 4/22  Dosing instructions: take one tab by mouth three times a day  The patient has been instructed regarding the correct time, dose, and frequency of taking this medication, including desired effects and most common side effects.   April Manson 9:50 AM 05/02/2020

## 2020-05-02 NOTE — Assessment & Plan Note (Signed)
Doing well with home exercises but has had exacerbations with hiking and running as of late.  Has noticed a Baker's cyst come and go. -Counseled on home exercise therapy and supportive care. -Duexis and samples provided. -Could consider aspiration injection if needed.

## 2020-05-02 NOTE — Patient Instructions (Signed)
Good to see you Please continue ice and compression  Please try the exercises   Please send me a message in MyChart with any questions or updates.  Please see me back in 2-3 months .   --Dr. Raeford Razor

## 2020-05-02 NOTE — Progress Notes (Signed)
Stanley Freeman - 59 y.o. male MRN [id number]  Date of birth: March 29, 1961  SUBJECTIVE:  Including CC & ROS.  No chief complaint on file.   Stanley Freeman is a 59 y.o. male that is following up for his right knee pain. Has done well but still gets fullness in the back of the knee. Has been able to hike and run with little to no pain. Does have pain afterwards.    Review of Systems See HPI   HISTORY: Past Medical, Surgical, Social, and Family History Reviewed & Updated per EMR.   Pertinent Historical Findings include:  Past Medical History:  Diagnosis Date  . Allergy   . GERD (gastroesophageal reflux disease)    SEV YRS AGO- OCC HEARTBURM- PRN OTC USE   . Hemorrhoids   . Hyperlipidemia   . PONV (postoperative nausea and vomiting)   . 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 Testosterone deficiency     Past Surgical History:  Procedure Laterality Date  . ANKLE SURGERY    . BACK SURGERY     DISC  . COLONOSCOPY     IN [place] 10 YRS AGO- NORMAL PER PT   . HEMORROID BANDING     X 2 WITH HELP PER PT   . INGUINAL HERNIA REPAIR    . MANDIBLE SURGERY    . UMBILICAL HERNIA REPAIR    . VASECTOMY      Family History  Problem Relation Age of Onset  . Diverticulitis Mother   . Colon polyps Mother   . Dementia Father   . Colon cancer Neg Hx   . Esophageal cancer Neg Hx   . Rectal cancer Neg Hx   . Stomach cancer Neg Hx     Social History   Socioeconomic History  . Marital status: Married    Spouse name: Not on file  . Number of children: Not on file  . Years of education: Not on file  . Highest education level: Not on file  Occupational History  . Not on file  Tobacco Use  . Smoking status: Never Smoker  . Smokeless tobacco: Never Used  Substance and Sexual Activity  . Alcohol use: Yes    Comment: SOCIAL 1-2 DRINKS A FEW DAYS A WEEK   . Drug use: Never  . Sexual activity: Not on file  Other Topics Concern  . Not  on file  Social History Narrative  . Not on file   Social Determinants of Health   Financial Resource Strain:   . Difficulty of Paying Living Expenses:   Food Insecurity:   . Worried About Charity fundraiser in the Last Year:   . Arboriculturist in the Last Year:   Transportation Needs:   . Film/video editor (Medical):   Marland Kitchen Lack of Transportation (Non-Medical):   Physical Activity:   . Days of Exercise per Week:   . Minutes of Exercise per Session:   Stress:   . Feeling of Stress :   Social Connections:   . Frequency of Communication with Friends and Family:   . Frequency of Social Gatherings with Friends and Family:   . Attends Religious Services:   . Active Member of Clubs or Organizations:   . Attends Archivist Meetings:   Marland Kitchen Marital Status:   Intimate Partner Violence:   . Fear of Current or Ex-Partner:   . Emotionally Abused:   .  Physically Abused:   . Sexually Abused:      PHYSICAL EXAM:  VS: BP 110/70   Ht 5\' 9"  (1.753 m)   Wt 161 lb (73 kg)   BMI 23.78 kg/m  Physical Exam Gen: NAD, alert, cooperative with exam, well-appearing MSK:  Right knee: Mild effusion. Small Baker's cyst. Normal range of motion. Normal strength resistance. Neurovascularly intact     ASSESSMENT & PLAN:   Degenerative tear of medial meniscus of right knee Doing well with home exercises but has had exacerbations with hiking and running as of late.  Has noticed a Baker's cyst come and go. -Counseled on home exercise therapy and supportive care. -Duexis and samples provided. -Could consider aspiration injection if needed.

## 2020-06-20 ENCOUNTER — Encounter: Payer: Self-pay | Admitting: Family Medicine

## 2020-06-20 ENCOUNTER — Other Ambulatory Visit: Payer: Self-pay

## 2020-06-20 ENCOUNTER — Ambulatory Visit (HOSPITAL_BASED_OUTPATIENT_CLINIC_OR_DEPARTMENT_OTHER)
Admission: RE | Admit: 2020-06-20 | Discharge: 2020-06-20 | Disposition: A | Discharge status: Home / Self Care | Payer: 59 | Source: Ambulatory Visit | Attending: Family Medicine | Admitting: Family Medicine

## 2020-06-20 ENCOUNTER — Ambulatory Visit (INDEPENDENT_AMBULATORY_CARE_PROVIDER_SITE_OTHER): Payer: PRIVATE HEALTH INSURANCE | Admitting: Family Medicine

## 2020-06-20 VITALS — BP 128/88 | HR 65 | Ht 69.0 in | Wt 160.0 lb

## 2020-06-20 DIAGNOSIS — M23203 Derangement of unspecified medial meniscus due to old tear or injury, right knee: Secondary | ICD-10-CM | POA: Insufficient documentation

## 2020-06-20 DIAGNOSIS — M25461 Effusion, right knee: Secondary | ICD-10-CM

## 2020-06-20 NOTE — Progress Notes (Signed)
Stanley Freeman - 59 y.o. male MRN [id number]  Date of birth: Feb 20, 1961  SUBJECTIVE:  Including CC & ROS.  Chief Complaint  Patient presents with  . Follow-up    right knee    Stanley Freeman is a 59 y.o. male that is presenting with acute worsening of his right knee.  It seems to be worse with any walking and running.  He has problems with bearing weight and will limp from time to time.  He has no pain with sitting down.   Review of Systems See HPI   HISTORY: Past Medical, Surgical, Social, and Family History Reviewed & Updated per EMR.   Pertinent Historical Findings include:  Past Medical History:  Diagnosis Date  . Allergy   . GERD (gastroesophageal reflux disease)    SEV YRS AGO- OCC HEARTBURM- PRN OTC USE   . Hemorrhoids   . Hyperlipidemia   . PONV (postoperative nausea and vomiting)   . Seizures ([place])    ON TOPAMAX AND LAMICTAL- LAST 2005 SEIZURES- GRAND MALX 2 IN EARLY 20'S   . Testosterone deficiency     Past Surgical History:  Procedure Laterality Date  . ANKLE SURGERY    . BACK SURGERY     DISC  . COLONOSCOPY     IN [place] 10 YRS AGO- NORMAL PER PT   . HEMORROID BANDING     X 2 WITH HELP PER PT   . INGUINAL HERNIA REPAIR    . MANDIBLE SURGERY    . UMBILICAL HERNIA REPAIR    . VASECTOMY      Family History  Problem Relation Age of Onset  . Diverticulitis Mother   . Colon polyps Mother   . Dementia Father   . Colon cancer Neg Hx   . Esophageal cancer Neg Hx   . Rectal cancer Neg Hx   . Stomach cancer Neg Hx     Social History   Socioeconomic History  . Marital status: Married    Spouse name: Not on file  . Number of children: Not on file  . Years of education: Not on file  . Highest education level: Not on file  Occupational History  . Not on file  Tobacco Use  . Smoking status: Never Smoker  . Smokeless tobacco: Never Used  Substance and Sexual Activity  . Alcohol use: Yes    Comment: SOCIAL 1-2 DRINKS A FEW DAYS A WEEK   . Drug use:  Never  . Sexual activity: Not on file  Other Topics Concern  . Not on file  Social History Narrative  . Not on file   Social Determinants of Health   Financial Resource Strain:   . Difficulty of Paying Living Expenses: Not on file  Food Insecurity:   . Worried About Charity fundraiser in the Last Year: Not on file  . Ran Out of Food in the Last Year: Not on file  Transportation Needs:   . Lack of Transportation (Medical): Not on file  . Lack of Transportation (Non-Medical): Not on file  Physical Activity:   . Days of Exercise per Week: Not on file  . Minutes of Exercise per Session: Not on file  Stress:   . Feeling of Stress : Not on file  Social Connections:   . Frequency of Communication with Friends and Family: Not on file  . Frequency of Social Gatherings with Friends and Family: Not on file  . Attends Religious Services: Not on file  . Active Member of  Clubs or Organizations: Not on file  . Attends Archivist Meetings: Not on file  . Marital Status: Not on file  Intimate Partner Violence:   . Fear of Current or Ex-Partner: Not on file  . Emotionally Abused: Not on file  . Physically Abused: Not on file  . Sexually Abused: Not on file     PHYSICAL EXAM:  VS: BP 128/88   Pulse 65   Ht 5\' 9"  (1.753 m)   Wt 160 lb (72.6 kg)   BMI 23.63 kg/m  Physical Exam Gen: NAD, alert, cooperative with exam, well-appearing MSK:  Right knee: Mild effusion. Normal range of motion. Tenderness palpation along the joint line. Tenderness to palpation of the proximal tibia. Neurovascularly intact     ASSESSMENT & PLAN:   Knee effusion, right Acutely deteriorating.  Pain seems to be worse than when he first presented.  Having much more pain with ambulation and weightbearing.  Concern for possible bony irritation other than just degenerative meniscal changes. -Counseled on supportive care. -Counseled on compression. -X-ray. -MRI to evaluate for insufficiency  fracture or degenerative meniscal changes.

## 2020-06-20 NOTE — Patient Instructions (Signed)
Good to see you Please try ice as needed  I will call with the xray results.   Please send me a message in MyChart with any questions or updates.  We will set up a virtual visit once the MRI is resulted.   --Dr. Raeford Razor

## 2020-06-20 NOTE — Assessment & Plan Note (Signed)
Acutely deteriorating.  Pain seems to be worse than when he first presented.  Having much more pain with ambulation and weightbearing.  Concern for possible bony irritation other than just degenerative meniscal changes. -Counseled on supportive care. -Counseled on compression. -X-ray. -MRI to evaluate for insufficiency fracture or degenerative meniscal changes.

## 2020-06-21 ENCOUNTER — Other Ambulatory Visit (HOSPITAL_BASED_OUTPATIENT_CLINIC_OR_DEPARTMENT_OTHER): Payer: Self-pay | Admitting: Internal Medicine

## 2020-06-21 ENCOUNTER — Encounter: Payer: Self-pay | Admitting: Family Medicine

## 2020-06-21 ENCOUNTER — Ambulatory Visit: Payer: PRIVATE HEALTH INSURANCE | Attending: Internal Medicine

## 2020-06-21 ENCOUNTER — Ambulatory Visit (INDEPENDENT_AMBULATORY_CARE_PROVIDER_SITE_OTHER): Payer: PRIVATE HEALTH INSURANCE | Admitting: Family Medicine

## 2020-06-21 ENCOUNTER — Other Ambulatory Visit: Payer: Self-pay

## 2020-06-21 VITALS — BP 110/68 | HR 70 | Temp 97.7°F | Ht 69.0 in | Wt 165.4 lb

## 2020-06-21 DIAGNOSIS — Z0000 Encounter for general adult medical examination without abnormal findings: Secondary | ICD-10-CM | POA: Diagnosis not present

## 2020-06-21 DIAGNOSIS — Z23 Encounter for immunization: Secondary | ICD-10-CM

## 2020-06-21 NOTE — Progress Notes (Signed)
Chief Complaint  Patient presents with  . Annual Exam    Well Male Stanley Freeman is here for a complete physical.   His last physical was >1 year ago.  Current diet: in general, a "healthy" diet.  Current exercise: cycling Weight trend: had intentionally lost some Fatigue out of ordinary? No. Seat belt? Yes.    Health maintenance Shingrix- Yes Colonoscopy- Yes Tetanus- Yes HIV- Yes Hep C- Yes   Past Medical History:  Diagnosis Date  . Allergy   . GERD (gastroesophageal reflux disease)    SEV YRS AGO- OCC HEARTBURM- PRN OTC USE   . Hemorrhoids   . Hyperlipidemia   . PONV (postoperative nausea and vomiting)   . 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 Testosterone deficiency       Past Surgical History:  Procedure Laterality Date  . ANKLE SURGERY    . BACK SURGERY     DISC  . COLONOSCOPY     IN [place] 10 YRS AGO- NORMAL PER PT   . HEMORROID BANDING     X 2 WITH HELP PER PT   . INGUINAL HERNIA REPAIR    . MANDIBLE SURGERY    . UMBILICAL HERNIA REPAIR    . VASECTOMY      Medications  Current Outpatient Medications on File Prior to Visit  Medication Sig Dispense Refill  . cetirizine (ZYRTEC) 10 MG tablet Take 10 mg by mouth daily.    . clotrimazole-betamethasone (LOTRISONE) cream Apply 1 application topically 2 (two) times daily. 30 g 2  . Diclofenac Sodium (PENNSAID) 2 % SOLN Place 1 application onto the skin 2 (two) times daily. 112 g 3  . fluticasone (FLONASE) 50 MCG/ACT nasal spray Place into both nostrils daily.    . Ibuprofen-Famotidine 800-26.6 MG TABS Take 1 tablet by mouth 3 (three) times daily as needed. 90 tablet 3  . lamoTRIgine (LAMICTAL) 200 MG tablet Take 1 tablet (200 mg total) by mouth 2 (two) times daily. 180 tablet 4  . montelukast (SINGULAIR) 10 MG tablet Take 1 tablet (10 mg total) by mouth at bedtime. 90 tablet 3  . testosterone (ANDROGEL) 50 MG/5GM (1%) GEL Place 5 g onto the skin daily.  3 PUMPS A DAY    . topiramate (TOPAMAX) 100 MG tablet Take 1 tablet (100 mg total) by mouth 2 (two) times daily. 180 tablet 4  . rosuvastatin (CRESTOR) 20 MG tablet Take 1 tablet (20 mg total) by mouth daily. 90 tablet 2    Allergies No Known Allergies  Family History Family History  Problem Relation Age of Onset  . Diverticulitis Mother   . Colon polyps Mother   . Dementia Father   . Colon cancer Neg Hx   . Esophageal cancer Neg Hx   . Rectal cancer Neg Hx   . Stomach cancer Neg Hx     Review of Systems: Constitutional:  no fevers Eye:  no recent significant change in vision Ear/Nose/Mouth/Throat:  Ears:  no hearing loss Nose/Mouth/Throat:  no complaints of nasal congestion, no sore throat Cardiovascular:  no chest pain Respiratory:  no shortness of breath Gastrointestinal:  no change in bowel habits GU:  Male: negative for dysuria, frequency Musculoskeletal/Extremities:  No new joint pain Integumentary (Skin/Breast):  no abnormal skin lesions reported Neurologic:  no headaches Endocrine: No unexpected weight changes Hematologic/Lymphatic:  no abnormal bleeding  Exam BP 110/68 (BP Location: Left Arm, Patient Position: Sitting,  Cuff Size: Normal)   Pulse 70   Temp 97.7 F (36.5 C) (Oral)   Ht 5\' 9"  (1.753 m)   Wt 165 lb 6 oz (75 kg)   SpO2 99%   BMI 24.42 kg/m  General:  well developed, well nourished, in no apparent distress Skin:  no significant moles, warts, or growths Head:  no masses, lesions, or tenderness Eyes:  pupils equal and round, sclera anicteric without injection Ears:  canals without lesions, TMs shiny without retraction, no obvious effusion, no erythema Nose:  nares patent, septum midline, mucosa normal Throat/Pharynx:  lips and gingiva without lesion; tongue and uvula midline; non-inflamed pharynx; no exudates or postnasal drainage Neck: neck supple without adenopathy, thyromegaly, or masses Cardiac: RRR, no bruits, no LE edema Lungs:  clear to  auscultation, breath sounds equal bilaterally, no respiratory distress Rectal: Deferred Musculoskeletal:  symmetrical muscle groups noted without atrophy or deformity Neuro:  gait normal; deep tendon reflexes normal and symmetric Psych: well oriented with normal range of affect and appropriate judgment/insight  Assessment and Plan  Well adult exam   Well 59 y.o. male. Counseled on diet and exercise. Follows w urology for T replacement, has PSA monitored there.  Immunizations, labs, and further orders as above. Follow up in 6 mo or prn, opted to defer labs to then. The patient voiced understanding and agreement to the plan.  [place], DO 06/21/20 1:45 PM

## 2020-06-21 NOTE — Patient Instructions (Addendum)
Keep the diet clean and stay active.  Let us know if you need anything. 

## 2020-06-21 NOTE — Progress Notes (Signed)
   Covid-19 Vaccination Clinic  Name:  Stanley Freeman    MRN: [id number] DOB: April 11, 1961  06/21/2020  [name] was observed post Covid-19 immunization for 15 minutes without incident. He was provided with Vaccine Information Sheet and instruction to access the V-Safe system. Vaccinated By: Sabra Heck  [name] was instructed to call 911 with any severe reactions post vaccine: Marland Kitchen Difficulty breathing  . Swelling of face and throat  . A fast heartbeat  . A bad rash all over body  . Dizziness and weakness

## 2020-06-24 ENCOUNTER — Telehealth: Payer: Self-pay | Admitting: Family Medicine

## 2020-06-24 NOTE — Telephone Encounter (Signed)
Left VM for patient. If he calls back please have him speak with a nurse/CMA and inform that his xrays look normal.   If any questions then please take the best time and phone number to call and I will try to call him back.   Rosemarie Ax, MD [facility] 06/24/2020, 9:25 AM

## 2020-06-27 ENCOUNTER — Telehealth: Payer: Self-pay | Admitting: Family Medicine

## 2020-06-27 NOTE — Telephone Encounter (Signed)
Faxed  MRI precert request & supporting med records to [facility] for review & approval ph#[phone] --Fax# [phone]  --- FYI  --glh

## 2020-07-15 ENCOUNTER — Ambulatory Visit
Admission: RE | Admit: 2020-07-15 | Discharge: 2020-07-15 | Disposition: A | Discharge status: Home / Self Care | Payer: PRIVATE HEALTH INSURANCE | Source: Ambulatory Visit | Attending: Family Medicine | Admitting: Family Medicine

## 2020-07-15 ENCOUNTER — Other Ambulatory Visit: Payer: Self-pay

## 2020-07-15 DIAGNOSIS — M25461 Effusion, right knee: Secondary | ICD-10-CM

## 2020-07-18 ENCOUNTER — Other Ambulatory Visit: Payer: Self-pay

## 2020-07-18 ENCOUNTER — Telehealth (INDEPENDENT_AMBULATORY_CARE_PROVIDER_SITE_OTHER): Payer: PRIVATE HEALTH INSURANCE | Admitting: Family Medicine

## 2020-07-18 DIAGNOSIS — S83241D Other tear of medial meniscus, current injury, right knee, subsequent encounter: Secondary | ICD-10-CM | POA: Diagnosis not present

## 2020-07-18 NOTE — Assessment & Plan Note (Signed)
MRI is revealing for a flap tear of the posterior horn of the medial meniscus.  He has had ongoing pain. -Counseled on home exercise therapy and supportive care. -We will pursue injection therapy. -Could consider PRP.

## 2020-07-18 NOTE — Progress Notes (Signed)
Virtual Visit via Video Note  I connected with Stanley Freeman on 07/18/20 at  8:00 AM EDT by a video enabled telemedicine application and verified that I am speaking with the correct person using two identifiers.  Location: Patient: work Provider: office   I discussed the limitations of evaluation and management by telemedicine and the availability of in person appointments. The patient expressed understanding and agreed to proceed.  History of Present Illness:  Stanley Freeman is a 59 yo M that is following up after the MRI of his right knee. His MRI is revealing for a flap tear of the posterior horn of the medial meniscus     Observations/Objective:  Gen: NAD, alert, cooperative with exam, well-appearing  Assessment and Plan:  Tears of the posterior horn of the medial meniscus: MRI is revealing for a flap tear of the posterior horn of the medial meniscus.  He has had ongoing pain. -Counseled on home exercise therapy and supportive care. -We will pursue injection therapy. -Could consider PRP.  Follow Up Instructions:    I discussed the assessment and treatment plan with the patient. The patient was provided an opportunity to ask questions and all were answered. The patient agreed with the plan and demonstrated an understanding of the instructions.   The patient was advised to call back or seek an in-person evaluation if the symptoms worsen or if the condition fails to improve as anticipated.   Clearance Coots, MD

## 2020-07-19 ENCOUNTER — Encounter: Payer: Self-pay | Admitting: Family Medicine

## 2020-07-19 ENCOUNTER — Ambulatory Visit: Payer: Self-pay

## 2020-07-19 ENCOUNTER — Ambulatory Visit (INDEPENDENT_AMBULATORY_CARE_PROVIDER_SITE_OTHER): Payer: PRIVATE HEALTH INSURANCE | Admitting: Family Medicine

## 2020-07-19 VITALS — BP 120/83 | HR 63 | Ht 69.0 in | Wt 160.0 lb

## 2020-07-19 DIAGNOSIS — S83241D Other tear of medial meniscus, current injury, right knee, subsequent encounter: Secondary | ICD-10-CM

## 2020-07-19 MED ORDER — TRIAMCINOLONE ACETONIDE 40 MG/ML IJ SUSP
40.0000 mg | Freq: Once | INTRAMUSCULAR | Status: AC
Start: 2020-07-19 — End: 2020-07-19
  Administered 2020-07-19: 40 mg via INTRA_ARTICULAR

## 2020-07-19 NOTE — Patient Instructions (Signed)
Good to see you Please try ice as needed   Please send me a message in MyChart with any questions or updates.  Please see [place] back as needed. Please let me know if you would want to try a PRP injection.   --Dr. Raeford Razor

## 2020-07-19 NOTE — Addendum Note (Signed)
Addended by: Sherrie George F on: 07/19/2020 11:28 AM   Modules accepted: Orders

## 2020-07-19 NOTE — Progress Notes (Signed)
Tremont Gavitt - 59 y.o. male MRN [id number]  Date of birth: [birth year]  SUBJECTIVE:  Including CC & ROS.  No chief complaint on file.   Horald Birky is a 59 y.o. male that is presenting for a knee injection   Review of Systems See HPI   HISTORY: Past Medical, Surgical, Social, and Family History Reviewed & Updated per EMR.   Pertinent Historical Findings include:  Past Medical History:  Diagnosis Date  . Allergy   . GERD (gastroesophageal reflux disease)    SEV YRS AGO- OCC HEARTBURM- PRN OTC USE   . Hemorrhoids   . Hyperlipidemia   . PONV (postoperative nausea and vomiting)   . 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 Testosterone deficiency     Past Surgical History:  Procedure Laterality Date  . ANKLE SURGERY    . BACK SURGERY     DISC  . COLONOSCOPY     IN [place] 10 YRS AGO- NORMAL PER PT   . HEMORROID BANDING     X 2 WITH HELP PER PT   . INGUINAL HERNIA REPAIR    . MANDIBLE SURGERY    . UMBILICAL HERNIA REPAIR    . VASECTOMY      Family History  Problem Relation Age of Onset  . Diverticulitis Mother   . Colon polyps Mother   . Dementia Father   . Colon cancer Neg Hx   . Esophageal cancer Neg Hx   . Rectal cancer Neg Hx   . Stomach cancer Neg Hx     Social History   Socioeconomic History  . Marital status: Married    Spouse name: Not on file  . Number of children: Not on file  . Years of education: Not on file  . Highest education level: Not on file  Occupational History  . Not on file  Tobacco Use  . Smoking status: Never Smoker  . Smokeless tobacco: Never Used  Substance and Sexual Activity  . Alcohol use: Yes    Comment: SOCIAL 1-2 DRINKS A FEW DAYS A WEEK   . Drug use: Never  . Sexual activity: Not on file  Other Topics Concern  . Not on file  Social History Narrative  . Not on file   Social Determinants of Health   Financial Resource Strain:   . Difficulty of Paying Living  Expenses: Not on file  Food Insecurity:   . Worried About Charity fundraiser in the Last Year: Not on file  . Ran Out of Food in the Last Year: Not on file  Transportation Needs:   . Lack of Transportation (Medical): Not on file  . Lack of Transportation (Non-Medical): Not on file  Physical Activity:   . Days of Exercise per Week: Not on file  . Minutes of Exercise per Session: Not on file  Stress:   . Feeling of Stress : Not on file  Social Connections:   . Frequency of Communication with Friends and Family: Not on file  . Frequency of Social Gatherings with Friends and Family: Not on file  . Attends Religious Services: Not on file  . Active Member of Clubs or Organizations: Not on file  . Attends Archivist Meetings: Not on file  . Marital Status: Not on file  Intimate Partner Violence:   . Fear of Current or Ex-Partner: Not on file  . Emotionally Abused: Not on file  .  Physically Abused: Not on file  . Sexually Abused: Not on file     PHYSICAL EXAM:  VS: There were no vitals taken for this visit. Physical Exam Gen: NAD, alert, cooperative with exam, well-appearing    Aspiration/Injection Procedure Note Hulbert Branscome 1961-09-08  Procedure: Injection Indications: Right knee pain  Procedure Details Consent: Risks of procedure as well as the alternatives and risks of each were explained to the (patient/caregiver).  Consent for procedure obtained. Time Out: Verified patient identification, verified procedure, site/side was marked, verified correct patient position, special equipment/implants available, medications/allergies/relevent history reviewed, required imaging and test results available.  Performed.  The area was cleaned with iodine and alcohol swabs.    The right knee superior lateral suprapatellar pouch was injected using 1 cc's of 40 mg Kenalog and 4 cc's of 0.25% bupivacaine with a 22 1 1/2" needle.  Ultrasound was used. Images were obtained in long  views showing the injection.     A sterile dressing was applied.  Patient did tolerate procedure well.      ASSESSMENT & PLAN:   Medial meniscus tear Recent MRI demonstrating flap tear of meniscus. -Counseled on home exercise therapy and supportive care. -Could consider hinged knee brace. -Injection. -Could consider PRP.

## 2020-07-19 NOTE — Assessment & Plan Note (Signed)
Recent MRI demonstrating flap tear of meniscus. -Counseled on home exercise therapy and supportive care. -Could consider hinged knee brace. -Injection. -Could consider PRP.

## 2020-10-22 ENCOUNTER — Telehealth: Payer: Self-pay | Admitting: Family Medicine

## 2020-10-22 MED ORDER — ROSUVASTATIN CALCIUM 20 MG PO TABS: 20.0000 mg | ORAL_TABLET | Freq: Every day | ORAL | 2 refills | Status: DC

## 2020-10-22 NOTE — Telephone Encounter (Signed)
Refill done.  

## 2020-10-22 NOTE — Telephone Encounter (Signed)
Medication: rosuvastatin (CRESTOR) 20 MG tablet [[id number]] ENDED  Has the patient contacted their pharmacy? No. (If no, request that the patient contact the pharmacy for the refill.) (If yes, when and what did the pharmacy advise?)  Preferred Pharmacy (with phone number or street name): [id number] - [place], [place] - [address]  [address] [place], [place] [address]  Phone:  [phone] Fax:  [phone]  DEA #:  [id number]  Agent: Please be advised that RX refills may take up to 3 business days. We ask that you follow-up with your pharmacy.

## 2020-12-10 ENCOUNTER — Ambulatory Visit: Payer: Self-pay

## 2020-12-10 ENCOUNTER — Encounter: Payer: Self-pay | Admitting: Family Medicine

## 2020-12-10 ENCOUNTER — Other Ambulatory Visit: Payer: Self-pay

## 2020-12-10 ENCOUNTER — Ambulatory Visit (INDEPENDENT_AMBULATORY_CARE_PROVIDER_SITE_OTHER): Payer: PRIVATE HEALTH INSURANCE | Admitting: Family Medicine

## 2020-12-10 ENCOUNTER — Ambulatory Visit (HOSPITAL_BASED_OUTPATIENT_CLINIC_OR_DEPARTMENT_OTHER)
Admission: RE | Admit: 2020-12-10 | Discharge: 2020-12-10 | Disposition: A | Discharge status: Home / Self Care | Payer: PRIVATE HEALTH INSURANCE | Source: Ambulatory Visit | Attending: Family Medicine | Admitting: Family Medicine

## 2020-12-10 VITALS — BP 120/86 | Ht 69.0 in | Wt 160.0 lb

## 2020-12-10 DIAGNOSIS — M179 Osteoarthritis of knee, unspecified: Secondary | ICD-10-CM | POA: Insufficient documentation

## 2020-12-10 DIAGNOSIS — M170 Bilateral primary osteoarthritis of knee: Secondary | ICD-10-CM

## 2020-12-10 MED ORDER — TRIAMCINOLONE ACETONIDE 40 MG/ML IJ SUSP
40.0000 mg | Freq: Once | INTRAMUSCULAR | Status: AC
  Administered 2020-12-10: 40 mg via INTRA_ARTICULAR

## 2020-12-10 NOTE — Patient Instructions (Signed)
Good to see you Please use ice as needed   Please send me a message in MyChart with any questions or updates.  We will inform you when the gel injections are in.   --Dr. Raeford Razor

## 2020-12-10 NOTE — Progress Notes (Signed)
Stanley Freeman - 60 y.o. male MRN [id number]  Date of birth: Aug 09, 1961  SUBJECTIVE:  Including CC & ROS.  No chief complaint on file.   Stanley Freeman is a 60 y.o. male that is presenting with acute on chronic right knee pain and acute left knee pain.  The pain is been ongoing for the past few weeks.  Denies any injury or inciting event.   Review of Systems See HPI   HISTORY: Past Medical, Surgical, Social, and Family History Reviewed & Updated per EMR.   Pertinent Historical Findings include:  Past Medical History:  Diagnosis Date  . Allergy   . GERD (gastroesophageal reflux disease)    SEV YRS AGO- OCC HEARTBURM- PRN OTC USE   . Hemorrhoids   . Hyperlipidemia   . PONV (postoperative nausea and vomiting)   . 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 Testosterone deficiency     Past Surgical History:  Procedure Laterality Date  . ANKLE SURGERY    . BACK SURGERY     DISC  . COLONOSCOPY     IN [place] 10 YRS AGO- NORMAL PER PT   . HEMORROID BANDING     X 2 WITH HELP PER PT   . INGUINAL HERNIA REPAIR    . MANDIBLE SURGERY    . UMBILICAL HERNIA REPAIR    . VASECTOMY      Family History  Problem Relation Age of Onset  . Diverticulitis Mother   . Colon polyps Mother   . Dementia Father   . Colon cancer Neg Hx   . Esophageal cancer Neg Hx   . Rectal cancer Neg Hx   . Stomach cancer Neg Hx     Social History   Socioeconomic History  . Marital status: Married    Spouse name: Not on file  . Number of children: Not on file  . Years of education: Not on file  . Highest education level: Not on file  Occupational History  . Not on file  Tobacco Use  . Smoking status: Never Smoker  . Smokeless tobacco: Never Used  Substance and Sexual Activity  . Alcohol use: Yes    Comment: SOCIAL 1-2 DRINKS A FEW DAYS A WEEK   . Drug use: Never  . Sexual activity: Not on file  Other Topics Concern  . Not on file  Social  History Narrative  . Not on file   Social Determinants of Health   Financial Resource Strain: Not on file  Food Insecurity: Not on file  Transportation Needs: Not on file  Physical Activity: Not on file  Stress: Not on file  Social Connections: Not on file  Intimate Partner Violence: Not on file     PHYSICAL EXAM:  VS: BP 120/86 (BP Location: Left Arm, Patient Position: Sitting, Cuff Size: Normal)   Ht 5\' 9"  (1.753 m)   Wt 160 lb (72.6 kg)   BMI 23.63 kg/m  Physical Exam Gen: NAD, alert, cooperative with exam, well-appearing MSK:  Right and left knee: No obvious effusion. Normal strength resistance. Tenderness palpation of the medial joint space. Neurovascular intact   Aspiration/Injection Procedure Note Rodd Heft [birth year]  Procedure: Injection Indications: Left knee pain  Procedure Details Consent: Risks of procedure as well as the alternatives and risks of each were explained to the (patient/caregiver).  Consent for procedure obtained. Time Out: Verified patient identification, verified procedure, site/side was marked, verified correct patient  position, special equipment/implants available, medications/allergies/relevent history reviewed, required imaging and test results available.  Performed.  The area was cleaned with iodine and alcohol swabs.    The left knee superior lateral suprapatellar pouch was injected using 1 cc's of 40 mg Kenalog and 4 cc's of 0.25% bupivacaine with a 22 1 1/2" needle.  Ultrasound was used. Images were obtained in long views showing the injection.     A sterile dressing was applied.  Patient did tolerate procedure well.  Aspiration/Injection Procedure Note Rafe Mackowski [birth year]  Procedure: Injection Indications: Right knee pain  Procedure Details Consent: Risks of procedure as well as the alternatives and risks of each were explained to the (patient/caregiver).  Consent for procedure obtained. Time Out: Verified patient  identification, verified procedure, site/side was marked, verified correct patient position, special equipment/implants available, medications/allergies/relevent history reviewed, required imaging and test results available.  Performed.  The area was cleaned with iodine and alcohol swabs.    The Right knee superior lateral suprapatellar pouch was injected using 1 cc's of 40 mg Kenalog and 4 cc's of 0.25% bupivacaine with a 22 1 1/2" needle.  Ultrasound was used. Images were obtained in long views showing the injection.     A sterile dressing was applied.  Patient did tolerate procedure well.   ASSESSMENT & PLAN:   Primary osteoarthritis of both knees Has degenerative changes that are likely associated with his pain. -Counseled on home exercise therapy and supportive care. -Bilateral injections. -Pursue gel injections -X-ray of left knee.

## 2020-12-10 NOTE — Assessment & Plan Note (Signed)
Has degenerative changes that are likely associated with his pain. -Counseled on home exercise therapy and supportive care. -Bilateral injections. -Pursue gel injections -X-ray of left knee.

## 2020-12-11 ENCOUNTER — Telehealth: Payer: Self-pay | Admitting: Family Medicine

## 2020-12-11 NOTE — Telephone Encounter (Signed)
Informed of results. The area of concern is likely the result after a steroid injection.   Rosemarie Ax, MD [facility] 12/11/2020, 3:06 PM

## 2020-12-16 ENCOUNTER — Encounter: Payer: Self-pay | Admitting: *Deleted

## 2020-12-16 NOTE — Progress Notes (Signed)
Patient approved for bilateral knee injections of Monovisc ([id number]) per [facility] with approval number of [id number]. Patient informed an will come in on 3.30.22 to get both injections with Dr Barbaraann Barthel

## 2020-12-18 ENCOUNTER — Encounter: Payer: Self-pay | Admitting: Family Medicine

## 2020-12-18 ENCOUNTER — Ambulatory Visit (INDEPENDENT_AMBULATORY_CARE_PROVIDER_SITE_OTHER): Payer: PRIVATE HEALTH INSURANCE | Admitting: Family Medicine

## 2020-12-18 ENCOUNTER — Other Ambulatory Visit: Payer: Self-pay

## 2020-12-18 DIAGNOSIS — M170 Bilateral primary osteoarthritis of knee: Secondary | ICD-10-CM

## 2020-12-18 NOTE — Progress Notes (Signed)
PCP: Shelda Pal, DO  Subjective:   HPI: Patient is a 60 y.o. male here for bilateral knee injections.  Patient returns for monovisc injections for both knees for osteoarthritis. Being seen in our office as Dr. Raeford Razor is out this week.  Past Medical History:  Diagnosis Date  . Allergy   . GERD (gastroesophageal reflux disease)    SEV YRS AGO- OCC HEARTBURM- PRN OTC USE   . Hemorrhoids   . Hyperlipidemia   . PONV (postoperative nausea and vomiting)   . Seizures ([place])    ON TOPAMAX AND LAMICTAL- LAST 2005 SEIZURES- GRAND MALX 2 IN EARLY 20'S   . Testosterone deficiency     Current Outpatient Medications on File Prior to Visit  Medication Sig Dispense Refill  . cetirizine (ZYRTEC) 10 MG tablet Take 10 mg by mouth daily.    . clotrimazole-betamethasone (LOTRISONE) cream Apply 1 application topically 2 (two) times daily. 30 g 2  . Diclofenac Sodium (PENNSAID) 2 % SOLN Place 1 application onto the skin 2 (two) times daily. 112 g 3  . fluticasone (FLONASE) 50 MCG/ACT nasal spray Place into both nostrils daily.    . Ibuprofen-Famotidine 800-26.6 MG TABS Take 1 tablet by mouth 3 (three) times daily as needed. 90 tablet 3  . lamoTRIgine (LAMICTAL) 200 MG tablet Take 1 tablet (200 mg total) by mouth 2 (two) times daily. 180 tablet 4  . montelukast (SINGULAIR) 10 MG tablet Take 1 tablet (10 mg total) by mouth at bedtime. 90 tablet 3  . rosuvastatin (CRESTOR) 20 MG tablet Take 1 tablet (20 mg total) by mouth daily. 90 tablet 2  . testosterone (ANDROGEL) 50 MG/5GM (1%) GEL Place 5 g onto the skin daily. 3 PUMPS A DAY    . Testosterone 1.62 % GEL APPLY 3 PUMPS DAILY AS DIRECTED    . topiramate (TOPAMAX) 100 MG tablet Take 1 tablet (100 mg total) by mouth 2 (two) times daily. 180 tablet 4   No current facility-administered medications on file prior to visit.    Past Surgical History:  Procedure Laterality Date  . ANKLE SURGERY    . BACK SURGERY     DISC  . COLONOSCOPY      IN [place] 10 YRS AGO- NORMAL PER PT   . HEMORROID BANDING     X 2 WITH HELP PER PT   . INGUINAL HERNIA REPAIR    . MANDIBLE SURGERY    . UMBILICAL HERNIA REPAIR    . VASECTOMY      No Known Allergies  Social History   Socioeconomic History  . Marital status: Married    Spouse name: Not on file  . Number of children: Not on file  . Years of education: Not on file  . Highest education level: Not on file  Occupational History  . Not on file  Tobacco Use  . Smoking status: Never Smoker  . Smokeless tobacco: Never Used  Substance and Sexual Activity  . Alcohol use: Yes    Comment: SOCIAL 1-2 DRINKS A FEW DAYS A WEEK   . Drug use: Never  . Sexual activity: Not on file  Other Topics Concern  . Not on file  Social History Narrative  . Not on file   Social Determinants of Health   Financial Resource Strain: Not on file  Food Insecurity: Not on file  Transportation Needs: Not on file  Physical Activity: Not on file  Stress: Not on file  Social Connections: Not on file  Intimate  Partner Violence: Not on file    Family History  Problem Relation Age of Onset  . Diverticulitis Mother   . Colon polyps Mother   . Dementia Father   . Colon cancer Neg Hx   . Esophageal cancer Neg Hx   . Rectal cancer Neg Hx   . Stomach cancer Neg Hx     BP 106/84   Ht 5\' 9"  (1.753 m)   Wt 162 lb (73.5 kg)   BMI 23.92 kg/m   No flowsheet data found.  No flowsheet data found.  Review of Systems: See HPI above.     Objective:  Physical Exam:  Gen: NAD, comfortable in exam room  Rest of exam not repeated today.   Assessment & Plan:  1. Bilateral knee osteoarthritis - monovisc injected as noted below.  Follow up as needed.  After informed written consent timeout was performed, patient was seated on exam table. Right knee was prepped with alcohol swab and utilizing anteromedial approach, patient's right knee was injected intraarticularly with 76mL lidocaine followed by monovisc.  Patient tolerated the procedure well without immediate complications.  After informed written consent timeout was performed, patient was seated on exam table. Left knee was prepped with alcohol swab and utilizing anteromedial approach, patient's left knee was injected intraarticularly with 63mL lidocaine followed by monovisc. Patient tolerated the procedure well without immediate complications.

## 2020-12-20 ENCOUNTER — Other Ambulatory Visit: Payer: Self-pay

## 2020-12-20 ENCOUNTER — Encounter: Payer: Self-pay | Admitting: Family Medicine

## 2020-12-20 ENCOUNTER — Ambulatory Visit: Payer: 59 | Admitting: Family Medicine

## 2020-12-20 VITALS — BP 112/68 | HR 82 | Temp 98.2°F | Ht 69.0 in | Wt 166.4 lb

## 2020-12-20 DIAGNOSIS — G43909 Migraine, unspecified, not intractable, without status migrainosus: Secondary | ICD-10-CM | POA: Insufficient documentation

## 2020-12-20 DIAGNOSIS — J302 Other seasonal allergic rhinitis: Secondary | ICD-10-CM

## 2020-12-20 DIAGNOSIS — E7800 Pure hypercholesterolemia, unspecified: Secondary | ICD-10-CM

## 2020-12-20 LAB — COMPREHENSIVE METABOLIC PANEL
ALT: 14 U/L (ref 0–53)
AST: 15 U/L (ref 0–37)
Albumin: 4.7 g/dL (ref 3.5–5.2)
Alkaline Phosphatase: 56 U/L (ref 39–117)
BUN: 15 mg/dL (ref 6–23)
CO2: 25 mEq/L (ref 19–32)
Calcium: 9.7 mg/dL (ref 8.4–10.5)
Chloride: 107 mEq/L (ref 96–112)
Creatinine, Ser: 1.4 mg/dL (ref 0.40–1.50)
GFR: 54.39 mL/min — ABNORMAL LOW (ref >60.00)
Glucose, Bld: 101 mg/dL — ABNORMAL HIGH (ref 70–99)
Potassium: 4.2 mEq/L (ref 3.5–5.1)
Sodium: 141 mEq/L (ref 135–145)
Total Bilirubin: 0.6 mg/dL (ref 0.2–1.2)
Total Protein: 6.5 g/dL (ref 6.0–8.3)

## 2020-12-20 LAB — LIPID PANEL
Cholesterol: 159 mg/dL (ref 0–200)
HDL: 42.9 mg/dL (ref >39.00)
LDL Cholesterol: 99 mg/dL (ref 0–99)
NonHDL: 116.46
Total CHOL/HDL Ratio: 4
Triglycerides: 87 mg/dL (ref 0.0–149.0)
VLDL: 17.4 mg/dL (ref 0.0–40.0)

## 2020-12-20 MED ORDER — AZELASTINE HCL 0.1 % NA SOLN: 2.0000 | Freq: Two times a day (BID) | NASAL | 3 refills | Status: DC

## 2020-12-20 MED ORDER — MONTELUKAST SODIUM 10 MG PO TABS: 10.0000 mg | ORAL_TABLET | Freq: Every day | ORAL | 3 refills | Status: DC

## 2020-12-20 MED ORDER — BUTALBITAL-ASPIRIN-CAFFEINE 50-325-40 MG PO CAPS: 2.0000 | ORAL_CAPSULE | Freq: Two times a day (BID) | ORAL | 0 refills | Status: DC | PRN

## 2020-12-20 MED ORDER — ROSUVASTATIN CALCIUM 20 MG PO TABS: 20.0000 mg | ORAL_TABLET | Freq: Every day | ORAL | 3 refills | Status: DC

## 2020-12-20 NOTE — Progress Notes (Signed)
Chief Complaint  Patient presents with  . Follow-up    Subjective: Hyperlipidemia Patient presents for Hyperlipidemia follow up. Currently taking Crestor 20 mg/d and compliance with treatment thus far has been good. He denies myalgias. He is adhering to a healthy diet. Exercise: cycling, some running, pickle ball The patient is not known to have coexisting coronary artery disease. No CP or SOB.   Seasonal allergies Patient with history of seasonal allergies.  He was diagnosed as a child and did receive allergy injections.  He no longer does.  He is relatively well controlled on Flonase, Singulair, and Zyrtec.  He is compliant with his regimen.  He will sometimes get nosebleeds but nothing consistent.  He has never been on Astelin nasal spray.  Pollen season seems to affect him the most since living in [place].  Migraines Patient with a long history of migraines.  He does follow-up with a neurologist for seizures.  He takes Lamictal.  He used to be on Fiorinal in the past that did help.  He gets less than 5/month where he would need something like this.  Usually Tylenol helps.  Stress is a trigger for him.  No weakness or balance issues.  Past Medical History:  Diagnosis Date  . Allergy   . GERD (gastroesophageal reflux disease)    SEV YRS AGO- OCC HEARTBURM- PRN OTC USE   . Hemorrhoids   . Hyperlipidemia   . PONV (postoperative nausea and vomiting)   . Seizures ([place])    ON TOPAMAX AND LAMICTAL- LAST 2005 SEIZURES- GRAND MALX 2 IN EARLY 20'S   . Testosterone deficiency     Objective: BP 112/68 (BP Location: Left Arm, Patient Position: Sitting, Cuff Size: Normal)   Pulse 82   Temp 98.2 F (36.8 C) (Oral)   Ht 5\' 9"  (1.753 m)   Wt 166 lb 6 oz (75.5 kg)   SpO2 97%   BMI 24.57 kg/m  General: Awake, appears stated age HEENT: MMM, ears negative, nares are patent without discharge Heart: RRR, no LE edema, no bruits Lungs: CTAB, no rales, wheezes or rhonchi. No accessory  muscle use Psych: Age appropriate judgment and insight, normal affect and mood  Assessment and Plan: Pure hypercholesterolemia - Plan: rosuvastatin (CRESTOR) 20 MG tablet, Comprehensive metabolic panel, Lipid panel  Seasonal allergies - Plan: montelukast (SINGULAIR) 10 MG tablet, azelastine (ASTELIN) 0.1 % nasal spray  Migraine without status migrainosus, not intractable, unspecified migraine type - Plan: butalbital-aspirin-caffeine (FIORINAL) 50-325-40 MG capsule  1.  Continue Crestor 20 mg daily.  Check labs. 2.  Continue Singulair 10 mg daily, Zyrtec 10 mg daily, Flonase 2 sprays daily.  Add Astelin as needed. 3.  Restart Fiorinal until he can get in with neuro.  They could probably get him better medications as needed like Nurtec. F/u 6 months for physical. The patient voiced understanding and agreement to the plan.  [place], DO 12/20/20  9:08 AM

## 2020-12-20 NOTE — Patient Instructions (Addendum)
Consider Aleve 440 mg as needed for headaches.   Give [place] 2-3 business days to get the results of your labs back.   Keep the diet clean and stay active.  Let us know if you need anything.

## 2021-01-27 ENCOUNTER — Ambulatory Visit: Payer: PRIVATE HEALTH INSURANCE | Admitting: Neurology

## 2021-01-30 ENCOUNTER — Ambulatory Visit: Payer: PRIVATE HEALTH INSURANCE | Admitting: Neurology

## 2021-02-20 ENCOUNTER — Ambulatory Visit: Payer: 59 | Admitting: Neurology

## 2021-03-06 ENCOUNTER — Other Ambulatory Visit: Payer: Self-pay

## 2021-03-06 ENCOUNTER — Ambulatory Visit: Payer: 59 | Admitting: Neurology

## 2021-03-06 ENCOUNTER — Encounter: Payer: Self-pay | Admitting: Neurology

## 2021-03-06 VITALS — BP 113/79 | HR 76 | Ht 69.0 in | Wt 167.0 lb

## 2021-03-06 DIAGNOSIS — G43709 Chronic migraine without aura, not intractable, without status migrainosus: Secondary | ICD-10-CM | POA: Diagnosis not present

## 2021-03-06 DIAGNOSIS — G43909 Migraine, unspecified, not intractable, without status migrainosus: Secondary | ICD-10-CM | POA: Diagnosis not present

## 2021-03-06 DIAGNOSIS — R569 Unspecified convulsions: Secondary | ICD-10-CM | POA: Diagnosis not present

## 2021-03-06 MED ORDER — UBRELVY 50 MG PO TABS: ORAL_TABLET | ORAL | 11 refills | Status: DC

## 2021-03-06 MED ORDER — LAMOTRIGINE 200 MG PO TABS: 200.0000 mg | ORAL_TABLET | Freq: Two times a day (BID) | ORAL | 4 refills | Status: DC

## 2021-03-06 MED ORDER — TOPIRAMATE 100 MG PO TABS: 100.0000 mg | ORAL_TABLET | Freq: Two times a day (BID) | ORAL | 4 refills | Status: DC

## 2021-03-06 MED ORDER — BUTALBITAL-ASPIRIN-CAFFEINE 50-325-40 MG PO CAPS: 2.0000 | ORAL_CAPSULE | Freq: Two times a day (BID) | ORAL | 3 refills | Status: DC | PRN

## 2021-03-06 NOTE — Progress Notes (Signed)
PATIENT: Stanley Freeman DOB: 03/11/1961  Chief Complaint  Patient presents with   Follow-up    Yearly follow up. Denies any seizure activity. Still doing well on lamotrigine 200mg , one tab BID and topiramate 100mg , one tab BID.      HISTORICAL  Stanley Freeman is a 60 year old male, seen in request by his primary care physician Dr. Nani Ravens, [place], for evaluation of seizure, initial evaluation was on Jan 29, 2020.  I have reviewed and summarized the referring note from the referring physician.  He had a past medical history of hypertension, hyperlipidemia, reported history of seizures since 1984.  He suffered his first nocturnal generalized seizure at age 50 in 63, had his second witnessed nocturnal seizure in 1985, reported normal MRI of the brain, EEG at that time, he was treated with Dilantin, since Dilantin treatment, he no longer have generalized seizure  However, he continue complains of intermittent morning time dj vu, he remembered while he was shaving, he has this waves of weird sensation throughout his body, last less than 1 minute, mild confusion afterwards, there was no tonic-clonic shaking, he was followed by his primary care physician during that period of time, around his 30s, stayed on Dilantin.  He had recurrent partial seizure, about once or twice every months.  He began to seek attention from neurologist after he settled in [place] in 2001, was seen by a neurologist Dr. 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  Laboratory evaluation April 2021, normal CMP, lipid panel, LDL 140, negative HIV, hepatitis C  UPDATE March 06 2021: reviewed his neurological clinic evaluation from [place] [facility] dated December 26, 2018 by Dr. 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  Seizure started since 1984, idiopathic, MRI of the brain was in 2000 02 that was normal, EEG was normal lamotrigine 200 mg twice a day, Topamax 100 mg twice a day  Last reported seizure was in 2005  Is overall doing very well, no recurrent seizure, continue to work full-time job, is taking Topamax 100 mg twice a day, lamotrigine 200 mg twice a day, despite last seizure was many years ago, he does not want to make any medication changes at this time, worried it might interrupt his routine, working capacity   He complains of headache all his life, 6 out of 10 holoacranial pressure sometimes throbbing headache with light noise sensitivity, movement made it worse, it can last 1 day or even longer, over-the-counter Tylenol, NSAIDs does not help, caffeine containing beverages was helpful, Fioricet as needed was helpful, his headache overall has much improved, but still having 1-2 times each week     REVIEW OF SYSTEMS: Full 14 system review of systems performed and notable only for as above All other review of systems were negative.  ALLERGIES: No Known Allergies  HOME MEDICATIONS: Current Outpatient  Medications  Medication Sig Dispense Refill   azelastine (ASTELIN) 0.1 % nasal spray Place 2 sprays into both nostrils 2 (two) times daily. Use in each nostril as directed 90 mL 3   butalbital-aspirin-caffeine (FIORINAL) 50-325-40 MG capsule Take 2 capsules by mouth 2 (two) times daily as  needed for headache. 28 capsule 0   cetirizine (ZYRTEC) 10 MG tablet Take 10 mg by mouth daily.     clotrimazole-betamethasone (LOTRISONE) cream Apply 1 application topically 2 (two) times daily. 30 g 2   fluticasone (FLONASE) 50 MCG/ACT nasal spray Place into both nostrils daily.     lamoTRIgine (LAMICTAL) 200 MG tablet Take 1 tablet (200 mg total) by mouth 2 (two) times daily. 180 tablet 4   montelukast (SINGULAIR) 10 MG tablet Take 1 tablet (10 mg total) by mouth at bedtime. 90 tablet 3   rosuvastatin (CRESTOR) 20 MG tablet Take 1 tablet (20 mg total) by mouth daily. 90 tablet 3   Testosterone 1.62 % GEL APPLY 3 PUMPS DAILY AS DIRECTED     topiramate (TOPAMAX) 100 MG tablet Take 1 tablet (100 mg total) by mouth 2 (two) times daily. 180 tablet 4   No current facility-administered medications for this visit.    PAST MEDICAL HISTORY: 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    Testosterone deficiency     PAST SURGICAL HISTORY: Past Surgical History:  Procedure Laterality Date   ANKLE SURGERY     BACK SURGERY     DISC   COLONOSCOPY     IN [place] 10 YRS AGO- NORMAL PER PT    HEMORROID BANDING     X 2 WITH HELP PER PT    INGUINAL HERNIA REPAIR     MANDIBLE SURGERY     UMBILICAL HERNIA REPAIR     VASECTOMY      FAMILY HISTORY: Family History  Problem Relation Age of Onset   Diverticulitis Mother    Colon polyps Mother    Dementia Father    Colon cancer Neg Hx    Esophageal cancer Neg Hx    Rectal cancer Neg Hx    Stomach cancer Neg Hx     SOCIAL HISTORY: Social History   Socioeconomic History   Marital status: Married    Spouse name: Not on file   Number of children: Not on file   Years of education: Not  on file   Highest education level: Not on file  Occupational History   Not on file  Tobacco Use   Smoking status: Never   Smokeless tobacco: Never  Substance and Sexual Activity   Alcohol use: Yes    Comment: SOCIAL 1-2 DRINKS A FEW DAYS A WEEK    Drug use: Never   Sexual activity: Not on file  Other Topics Concern   Not on file  Social History Narrative   Not on file   Social Determinants of Health   Financial Resource Strain: Not on file  Food Insecurity: Not on file  Transportation Needs: Not on file  Physical Activity: Not on file  Stress: Not on file  Social Connections: Not on file  Intimate Partner Violence: Not on file  PHYSICAL EXAM   Vitals:   03/06/21 0906  BP: 113/79  Pulse: 76  Weight: 167 lb (75.8 kg)  Height: 5\' 9"  (1.753 m)   Not recorded     Body mass index is 24.66 kg/m.  PHYSICAL EXAMNIATION:  Gen: NAD, conversant, well nourised, well groomed         NEUROLOGICAL EXAM:  MENTAL STATUS: Speech/cognition Awake, alert, oriented to history taking and casual conversation CRANIAL NERVES: CN II: Visual fields are full to confrontation. Pupils are round equal and briskly reactive to light. CN III, IV, VI: extraocular movement are normal. No ptosis. CN V: Facial sensation is intact to light touch CN VII: Face is symmetric with normal eye closure  CN VIII: Hearing is normal to causal conversation. CN IX, X: Phonation is normal. CN XI: Head turning and shoulder shrug are intact  MOTOR: There is no pronator drift of out-stretched arms. Muscle bulk and tone are normal. Muscle strength is normal.  REFLEXES: Reflexes are 2+ and symmetric at the biceps, triceps, knees, and ankles. Plantar responses are flexor.  SENSORY: Intact to light touch, pinprick and vibratory sensation are intact in fingers and toes.  COORDINATION: There is no trunk or limb dysmetria noted.  GAIT/STANCE: Posture is normal. Gait is normal.   DIAGNOSTIC DATA  (LABS, IMAGING, TESTING) - I reviewed patient records, labs, notes, testing and imaging myself where available.   ASSESSMENT AND PLAN  Stanley Freeman is a 60 y.o. male   Complex partial seizure  Record showed MRI of the brain, EEG in the past  Discussed with patient, despite last reported seizure was many years before current medications, wondering about changing of the medication might cause trouble for his routine  and working capacity  Refill his lamotrigine 200 twice a day, Topamax 100 mg twice a day  Migraine,   Fiorinal as needed for moderate to severe headaches  Marcial Pacas, M.D. Ph.D.  [facility] [address], [place], [place] Ph: [phone] Fax: [phone]  CC: Referring Provider

## 2021-03-06 NOTE — Patient Instructions (Signed)
Meds ordered this encounter  Medications   Ubrogepant (UBRELVY) 50 MG TABS    Sig: Take 1 tab at onset of migraine.  May repeat in 2 hrs, if needed.  Max dose: 2 tabs/day. This is a 30 day prescription.    Dispense:  12 tablet    Refill:  11   lamoTRIgine (LAMICTAL) 200 MG tablet    Sig: Take 1 tablet (200 mg total) by mouth 2 (two) times daily.    Dispense:  180 tablet    Refill:  4   topiramate (TOPAMAX) 100 MG tablet    Sig: Take 1 tablet (100 mg total) by mouth 2 (two) times daily.    Dispense:  180 tablet    Refill:  4   butalbital-aspirin-caffeine (FIORINAL) 50-325-40 MG capsule    Sig: Take 2 capsules by mouth 2 (two) times daily as needed for headache.    Dispense:  36 capsule    Refill:  3    36 tabs for 3 months

## 2021-03-10 ENCOUNTER — Telehealth: Payer: Self-pay | Admitting: Neurology

## 2021-03-10 MED ORDER — UBRELVY 50 MG PO TABS
ORAL_TABLET | ORAL | 11 refills | Status: DC
Start: 2021-03-10 — End: 2022-04-02

## 2021-03-10 NOTE — Telephone Encounter (Signed)
PA approved through 03/10/2022. I have called the patient and provided this update.

## 2021-03-10 NOTE — Telephone Encounter (Addendum)
The medication was denied by insurance incorrectly. He met criteria: trying at least 2 triptans (sumatriptan, rizatriptan, zolmitriptan), prescribed by neurologist, not using in combination w/ Nurtec, being prescribed for acute migraines. If approved, his plan only allows #8/30 days. His rx will need to be updated.  I called [employer] [phone]) and opened new case with this information.  [id number] completed verbally. Decision pending.

## 2021-03-10 NOTE — Telephone Encounter (Signed)
Pt called, insurance faxed over request for PA for Ubrogepant (UBRELVY) 50 MG TABS. Would like a call from the nurse to confirm PA has been sent to insurance.

## 2021-04-23 ENCOUNTER — Other Ambulatory Visit: Payer: Self-pay

## 2021-04-23 ENCOUNTER — Ambulatory Visit: Payer: 59 | Admitting: Family Medicine

## 2021-04-23 ENCOUNTER — Encounter: Payer: Self-pay | Admitting: Family Medicine

## 2021-04-23 ENCOUNTER — Ambulatory Visit (HOSPITAL_BASED_OUTPATIENT_CLINIC_OR_DEPARTMENT_OTHER)
Admission: RE | Admit: 2021-04-23 | Discharge: 2021-04-23 | Disposition: A | Discharge status: Home / Self Care | Payer: 59 | Source: Ambulatory Visit | Attending: Family Medicine | Admitting: Family Medicine

## 2021-04-23 VITALS — BP 108/68 | HR 75 | Temp 98.4°F | Ht 69.0 in | Wt 169.5 lb

## 2021-04-23 DIAGNOSIS — R0600 Dyspnea, unspecified: Secondary | ICD-10-CM | POA: Insufficient documentation

## 2021-04-23 DIAGNOSIS — Z801 Family history of malignant neoplasm of trachea, bronchus and lung: Secondary | ICD-10-CM

## 2021-04-23 NOTE — Progress Notes (Signed)
Chief Complaint  Patient presents with   discuss family history of Cancer    Subjective: Patient is a 60 y.o. male here for follow-up.  The patient's brother was diagnosed with stage IV cancer recently.  His brother never smoked and took relatively good care of himself.  Their mother has breast cancer and there is prostate cancer in his family.  He was not exposed to any known carcinogens to his knowledge.  The patient has had shortness of breath over the past 4 years but attributes it to allergy induced asthma.  He is not coughing or having any unexplained weight changes.  He is up-to-date with his colon cancer screening and follows with urology for routine PSA surveillance.  He does not smoke.  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    Testosterone deficiency     Objective: BP 108/68   Pulse 75   Temp 98.4 F (36.9 C) (Oral)   Ht '5\' 9"'$  (1.753 m)   Wt 169 lb 8 oz (76.9 kg)   SpO2 98%   BMI 25.03 kg/m  General: Awake, appears stated age Lungs: No accessory muscle use Psych: Age appropriate judgment and insight, normal affect and mood  Assessment and Plan: Dyspnea, unspecified type - Plan: DG Chest 2 View  Family history of lung cancer - Plan: Ambulatory referral to Genetics  Check chest x-ray for breathing issues.  At the very least this could rule in or out a pulmonary nodule. Refer to genetics for further discussion.  There are no other screening tools we would use for him right now. The patient voiced understanding and agreement to the plan.  I spent 33 minutes with the patient discussing the above in addition to reviewing his chart-screening history on the same day of the visit.  [place],  DO 04/23/21  10:09 AM

## 2021-04-23 NOTE — Patient Instructions (Signed)
We will be in touch with your X-ray results.  If you do not hear anything about your referral in the next 1-2 weeks, call our office and ask for an update.  Let us know if you need anything.

## 2021-06-24 ENCOUNTER — Encounter: Payer: 59 | Admitting: Family Medicine

## 2021-07-11 ENCOUNTER — Other Ambulatory Visit: Payer: Self-pay

## 2021-07-11 ENCOUNTER — Encounter: Payer: Self-pay | Admitting: Family Medicine

## 2021-07-11 ENCOUNTER — Ambulatory Visit (INDEPENDENT_AMBULATORY_CARE_PROVIDER_SITE_OTHER): Payer: 59 | Admitting: Family Medicine

## 2021-07-11 VITALS — BP 102/60 | HR 87 | Temp 96.7°F | Ht 69.0 in | Wt 172.2 lb

## 2021-07-11 DIAGNOSIS — E7800 Pure hypercholesterolemia, unspecified: Secondary | ICD-10-CM

## 2021-07-11 DIAGNOSIS — Z0000 Encounter for general adult medical examination without abnormal findings: Secondary | ICD-10-CM | POA: Diagnosis not present

## 2021-07-11 LAB — CBC
HCT: 47.1 % (ref 39.0–52.0)
Hemoglobin: 15.5 g/dL (ref 13.0–17.0)
MCHC: 33 g/dL (ref 30.0–36.0)
MCV: 93.4 fl (ref 78.0–100.0)
Platelets: 227 10*3/uL (ref 150.0–400.0)
RBC: 5.04 Mil/uL (ref 4.22–5.81)
RDW: 13.2 % (ref 11.5–15.5)
WBC: 4.2 10*3/uL (ref 4.0–10.5)

## 2021-07-11 LAB — LIPID PANEL
Cholesterol: 156 mg/dL (ref 0–200)
HDL: 46.9 mg/dL (ref >39.00)
LDL Cholesterol: 93 mg/dL (ref 0–99)
NonHDL: 109.33
Total CHOL/HDL Ratio: 3
Triglycerides: 84 mg/dL (ref 0.0–149.0)
VLDL: 16.8 mg/dL (ref 0.0–40.0)

## 2021-07-11 LAB — COMPREHENSIVE METABOLIC PANEL
ALT: 15 U/L (ref 0–53)
AST: 15 U/L (ref 0–37)
Albumin: 4.9 g/dL (ref 3.5–5.2)
Alkaline Phosphatase: 60 U/L (ref 39–117)
BUN: 22 mg/dL (ref 6–23)
CO2: 25 mEq/L (ref 19–32)
Calcium: 9.8 mg/dL (ref 8.4–10.5)
Chloride: 108 mEq/L (ref 96–112)
Creatinine, Ser: 1.5 mg/dL (ref 0.40–1.50)
GFR: 49.87 mL/min — ABNORMAL LOW (ref >60.00)
Glucose, Bld: 89 mg/dL (ref 70–99)
Potassium: 4.1 mEq/L (ref 3.5–5.1)
Sodium: 142 mEq/L (ref 135–145)
Total Bilirubin: 0.6 mg/dL (ref 0.2–1.2)
Total Protein: 6.7 g/dL (ref 6.0–8.3)

## 2021-07-11 NOTE — Patient Instructions (Addendum)
Give us 2-3 business days to get the results of your labs back.   Keep the diet clean and stay active.  Let us know if you need anything. 

## 2021-07-11 NOTE — Progress Notes (Signed)
Chief Complaint  Patient presents with   Annual Exam    Cpe - fasting     Well Male Stanley Freeman is here for a complete physical.   His last physical was >1 year ago.  Current diet: in general, a "healthy" diet.  Current exercise: pickle ball, hiking Weight trend: stable Fatigue out of ordinary? No. Seat belt? Yes.    Health maintenance Shingrix- Yes Colonoscopy- Yes Tetanus- Yes HIV- Yes Hep C- Yes   Past Medical History:  Diagnosis Date   Allergy    GERD (gastroesophageal reflux disease)    SEV YRS AGO- OCC HEARTBURM- PRN OTC USE    Hemorrhoids    Hyperlipidemia    PONV (postoperative nausea and vomiting)    Seizures ([place])    ON TOPAMAX AND LAMICTAL- LAST 2005 SEIZURES- GRAND MALX 2 IN EARLY 20'S    Testosterone deficiency       Past Surgical History:  Procedure Laterality Date   ANKLE SURGERY     BACK SURGERY     DISC   COLONOSCOPY     IN [place] 10 YRS AGO- NORMAL PER PT    HEMORROID BANDING     X 2 WITH HELP PER PT    INGUINAL HERNIA REPAIR     MANDIBLE SURGERY     UMBILICAL HERNIA REPAIR     VASECTOMY      Medications  Current Outpatient Medications on File Prior to Visit  Medication Sig Dispense Refill   azelastine (ASTELIN) 0.1 % nasal spray Place 2 sprays into both nostrils 2 (two) times daily. Use in each nostril as directed 90 mL 3   butalbital-aspirin-caffeine (FIORINAL) 50-325-40 MG capsule Take 2 capsules by mouth 2 (two) times daily as needed for headache. 36 capsule 3   cetirizine (ZYRTEC) 10 MG tablet Take 10 mg by mouth daily.     clotrimazole-betamethasone (LOTRISONE) cream Apply 1 application topically 2 (two) times daily. 30 g 2   fluticasone (FLONASE) 50 MCG/ACT nasal spray Place into both nostrils daily.     lamoTRIgine (LAMICTAL) 200 MG tablet Take 1 tablet (200 mg total) by mouth 2 (two) times daily. 180 tablet 4   montelukast (SINGULAIR) 10 MG tablet Take 1 tablet (10 mg total) by mouth at bedtime. 90 tablet 3   rosuvastatin  (CRESTOR) 20 MG tablet Take 1 tablet (20 mg total) by mouth daily. 90 tablet 3   Testosterone 1.62 % GEL APPLY 3 PUMPS DAILY AS DIRECTED     topiramate (TOPAMAX) 100 MG tablet Take 1 tablet (100 mg total) by mouth 2 (two) times daily. 180 tablet 4   Ubrogepant (UBRELVY) 50 MG TABS Take 1 tab at onset of migraine.  May repeat in 2 hrs, if needed.  Max dose: 2 tabs/day. This is a 30 day prescription. 8 tablet 11   Allergies No Known Allergies  Family History Family History  Problem Relation Age of Onset   Diverticulitis Mother    Colon polyps Mother    Dementia Father    Colon cancer Neg Hx    Esophageal cancer Neg Hx    Rectal cancer Neg Hx    Stomach cancer Neg Hx     Review of Systems: Constitutional:  no fevers Eye:  no recent significant change in vision Ear/Nose/Mouth/Throat:  Ears:  no hearing loss Nose/Mouth/Throat:  no complaints of nasal congestion, no sore throat Cardiovascular:  no chest pain Respiratory:  no shortness of breath Gastrointestinal:  no change in bowel habits GU:  Male:  negative for dysuria, frequency Musculoskeletal/Extremities:  +chronic b/l knee pain Integumentary (Skin/Breast):  no abnormal skin lesions reported Neurologic:  no headaches Endocrine: No unexpected weight changes Hematologic/Lymphatic:  no abnormal bleeding  Exam BP 102/60   Pulse 87   Temp (!) 96.7 F (35.9 C) (Temporal)   Ht 5\' 9"  (1.753 m)   Wt 172 lb 3.2 oz (78.1 kg)   SpO2 98%   BMI 25.43 kg/m  General:  well developed, well nourished, in no apparent distress Skin:  no significant moles, warts, or growths Head:  no masses, lesions, or tenderness Eyes:  pupils equal and round, sclera anicteric without injection Ears:  canals without lesions, TMs shiny without retraction, no obvious effusion, no erythema Nose:  nares patent, septum midline, mucosa normal Throat/Pharynx:  lips and gingiva without lesion; tongue and uvula midline; non-inflamed pharynx; no exudates or  postnasal drainage Neck: neck supple without adenopathy, thyromegaly, or masses Cardiac: RRR, no bruits, no LE edema Lungs:  clear to auscultation, breath sounds equal bilaterally, no respiratory distress Abdomen: BS+, soft, non-tender, non-distended, no masses or organomegaly noted Rectal: Deferred Musculoskeletal:  symmetrical muscle groups noted without atrophy or deformity Neuro:  gait normal; deep tendon reflexes normal and symmetric Psych: well oriented with normal range of affect and appropriate judgment/insight  Assessment and Plan  Well adult exam - Plan: Comprehensive metabolic panel, Lipid panel, CBC  Pure hypercholesterolemia   Well 60 y.o. male. Counseled on diet and exercise. PSA checked thru urology.  Immunizations, labs, and further orders as above. UTD w immunizations.  Follow up in 6 mo or prn. The patient voiced understanding and agreement to the plan.  [place], DO 07/11/21 7:40 AM

## 2021-08-12 ENCOUNTER — Encounter: Payer: Self-pay | Admitting: Family Medicine

## 2021-08-12 ENCOUNTER — Ambulatory Visit: Payer: 59 | Admitting: Family Medicine

## 2021-08-12 VITALS — BP 108/72 | Ht 69.0 in | Wt 172.0 lb

## 2021-08-12 DIAGNOSIS — M1712 Unilateral primary osteoarthritis, left knee: Secondary | ICD-10-CM

## 2021-08-12 NOTE — Patient Instructions (Signed)
Good to see you We'll have the MRI at the [facility]  Please send me a message in MyChart with any questions or updates.  We'll schedule a follow up after the MRI results are in.   --Dr. Raeford Razor

## 2021-08-12 NOTE — Assessment & Plan Note (Signed)
Acute on chronic in nature. Having limits in his range of  motion. Has tried over 6 weeks of home exercise therapy, medications, and injections - counseled on home exercise therapy and supportive care -MRI of the left knee to evaluate for a chondral defect or meniscal derangement.

## 2021-08-12 NOTE — Progress Notes (Signed)
  Stanley Freeman - 60 y.o. male MRN [id number]  Date of birth: [birth year]  SUBJECTIVE:  Including CC & ROS.  No chief complaint on file.   Stanley Freeman is a 60 y.o. male that is  presenting with acute on chronic left knee pain. Has been ongoing for about a year. Having limits in his flexion and extension. Pain is occurring over the medial compartment.   Review of Systems See HPI   HISTORY: Past Medical, Surgical, Social, and Family History Reviewed & Updated per EMR.   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    Testosterone deficiency     Past Surgical History:  Procedure Laterality Date   ANKLE SURGERY     BACK SURGERY     DISC   COLONOSCOPY     IN [place] 10 YRS AGO- NORMAL PER PT    HEMORROID BANDING     X 2 WITH HELP PER PT    INGUINAL HERNIA REPAIR     MANDIBLE SURGERY     UMBILICAL HERNIA REPAIR     VASECTOMY      Family History  Problem Relation Age of Onset   Diverticulitis Mother    Colon polyps Mother    Dementia Father    Colon cancer Neg Hx    Esophageal cancer Neg Hx    Rectal cancer Neg Hx    Stomach cancer Neg Hx     Social History   Socioeconomic History   Marital status: Married    Spouse name: Not on file   Number of children: Not on file   Years of education: Not on file   Highest education level: Not on file  Occupational History   Not on file  Tobacco Use   Smoking status: Never   Smokeless tobacco: Never  Substance and Sexual Activity   Alcohol use: Yes    Comment: SOCIAL 1-2 DRINKS A FEW DAYS A WEEK    Drug use: Never   Sexual activity: Not on file  Other Topics Concern   Not on file  Social History Narrative   Not on file    Social Determinants of Health   Financial Resource Strain: Not on file  Food Insecurity: Not on file  Transportation Needs: Not on file  Physical Activity: Not on file  Stress: Not on file  Social Connections: Not on file  Intimate Partner Violence: Not on file     PHYSICAL EXAM:  VS: BP 108/72 (BP Location: Left Arm, Patient Position: Sitting)   Ht 5\' 9"  (1.753 m)   Wt 172 lb (78 kg)   BMI 25.40 kg/m  Physical Exam Gen: NAD, alert, cooperative with exam, well-appearing     ASSESSMENT & PLAN:   OA (osteoarthritis) of knee Acute on chronic in nature. Having limits in his range of  motion. Has tried over 6 weeks of home exercise therapy, medications, and injections - counseled on home exercise therapy and supportive care -MRI of the left knee to evaluate for a chondral defect or meniscal derangement.

## 2021-08-13 ENCOUNTER — Other Ambulatory Visit: Payer: Self-pay | Admitting: Family Medicine

## 2021-09-03 ENCOUNTER — Encounter: Payer: Self-pay | Admitting: Family Medicine

## 2021-09-03 ENCOUNTER — Ambulatory Visit: Payer: 59 | Admitting: Family Medicine

## 2021-09-03 VITALS — BP 108/60 | HR 83 | Temp 98.0°F | Ht 69.0 in | Wt 177.2 lb

## 2021-09-03 DIAGNOSIS — J0100 Acute maxillary sinusitis, unspecified: Secondary | ICD-10-CM

## 2021-09-03 MED ORDER — AZITHROMYCIN 250 MG PO TABS: ORAL_TABLET | ORAL | 0 refills | Status: DC

## 2021-09-03 NOTE — Progress Notes (Signed)
Chief Complaint  Patient presents with   Sinusitis   Cough    [name] here for URI complaints.  Duration: 2 weeks  Associated symptoms: sinus congestion, sinus pain, rhinorrhea, and coughing Denies: itchy watery eyes, ear pain, ear drainage, sore throat, wheezing, shortness of breath, myalgia, and fevers Treatment to date: Flonase, PO anthist, amox, Sudafed, Benadryl Sick contacts: No Tested neg for covid.   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    Testosterone deficiency     Objective BP 108/60    Pulse 83    Temp 98 F (36.7 C) (Oral)    Ht 5\' 9"  (1.753 m)    Wt 177 lb 4 oz (80.4 kg)    SpO2 99%    BMI 26.18 kg/m  General: Awake, alert, appears stated age HEENT: AT, [place], ears patent b/l and TM's neg, max sinuses ttp mildly, no frontal sinus ttp, nares patent w/o discharge, pharynx pink and without exudates, MMM Neck: No masses or asymmetry Heart: RRR Lungs: CTAB, no accessory muscle use Psych: Age appropriate judgment and insight, normal mood and affect  Acute maxillary sinusitis, recurrence not specified - Plan: azithromycin (ZITHROMAX) 250 MG tablet  Continue to push fluids, practice good hand hygiene, cover mouth when coughing. F/u prn. If starting to experience fevers, shaking, or shortness of breath, seek immediate care. Pt voiced understanding and agreement to the plan.  [place], DO 09/03/21 7:47 AM

## 2021-09-03 NOTE — Patient Instructions (Signed)
Continue to push fluids, practice good hand hygiene, and cover your mouth if you cough. ? ?If you start having fevers, shaking or shortness of breath, seek immediate care. ? ?OK to take Tylenol 1000 mg (2 extra strength tabs) or 975 mg (3 regular strength tabs) every 6 hours as needed. ? ?Let us know if you need anything. ?

## 2021-09-29 ENCOUNTER — Other Ambulatory Visit: Payer: Self-pay

## 2021-09-29 ENCOUNTER — Ambulatory Visit (INDEPENDENT_AMBULATORY_CARE_PROVIDER_SITE_OTHER): Payer: 59

## 2021-09-29 DIAGNOSIS — G8929 Other chronic pain: Secondary | ICD-10-CM | POA: Diagnosis not present

## 2021-09-29 DIAGNOSIS — M25562 Pain in left knee: Secondary | ICD-10-CM | POA: Diagnosis not present

## 2021-09-29 DIAGNOSIS — M1712 Unilateral primary osteoarthritis, left knee: Secondary | ICD-10-CM

## 2021-10-01 ENCOUNTER — Encounter: Payer: Self-pay | Admitting: Family Medicine

## 2021-10-01 ENCOUNTER — Telehealth (INDEPENDENT_AMBULATORY_CARE_PROVIDER_SITE_OTHER): Payer: 59 | Admitting: Family Medicine

## 2021-10-01 DIAGNOSIS — M1712 Unilateral primary osteoarthritis, left knee: Secondary | ICD-10-CM | POA: Diagnosis not present

## 2021-10-01 NOTE — Progress Notes (Signed)
Virtual Visit via Video Note  I connected with Stanley Freeman on 10/01/21 at  8:10 AM EST by a video enabled telemedicine application and verified that I am speaking with the correct person using two identifiers.  Location: Patient: home Provider: office   I discussed the limitations of evaluation and management by telemedicine and the availability of in person appointments. The patient expressed understanding and agreed to proceed.  History of Present Illness:  Stanley Freeman is a 61 yo M that is following up after the MRI of his left knee.  This was revealing for a nondisplaced focal tear of the posterior horn of the medial meniscus.  This showed mild medial joint arthritis and a grade 1 MCL sprain.    Observations/Objective:   Assessment and Plan:  OA left knee:  MRi was revealing for meniscal changes and degenerative changes.  - counseled on home exercise therapy and supportive care - pursue PRP injection   Follow Up Instructions:    I discussed the assessment and treatment plan with the patient. The patient was provided an opportunity to ask questions and all were answered. The patient agreed with the plan and demonstrated an understanding of the instructions.   The patient was advised to call back or seek an in-person evaluation if the symptoms worsen or if the condition fails to improve as anticipated.    Clearance Coots, MD

## 2021-10-01 NOTE — Assessment & Plan Note (Signed)
MRi was revealing for meniscal changes and degenerative changes.  - counseled on home exercise therapy and supportive care - pursue PRP injection

## 2021-10-10 ENCOUNTER — Ambulatory Visit: Payer: Self-pay | Admitting: Family Medicine

## 2021-10-10 ENCOUNTER — Ambulatory Visit: Payer: Self-pay

## 2021-10-10 VITALS — BP 128/70 | Ht 69.0 in | Wt 177.0 lb

## 2021-10-10 DIAGNOSIS — M1712 Unilateral primary osteoarthritis, left knee: Secondary | ICD-10-CM

## 2021-10-10 NOTE — Progress Notes (Signed)
°  Stanley Freeman - 61 y.o. male MRN [id number]  Date of birth: 07-24-1961  SUBJECTIVE:  Including CC & ROS.  No chief complaint on file.   Stanley Freeman is a 61 y.o. male that is here for PRP injection of his left knee.   Review of Systems See HPI   HISTORY: Past Medical, Surgical, Social, and Family History Reviewed & Updated per EMR.   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    Testosterone deficiency     Past Surgical History:  Procedure Laterality Date   ANKLE SURGERY     BACK SURGERY     DISC   COLONOSCOPY     IN [place] 10 YRS AGO- NORMAL PER PT    HEMORROID BANDING     X 2 WITH HELP PER PT    INGUINAL HERNIA REPAIR     MANDIBLE SURGERY     UMBILICAL HERNIA REPAIR     VASECTOMY       PHYSICAL EXAM:  VS: BP 128/70 (BP Location: Left Arm, Patient Position: Sitting)    Ht 5\' 9"  (1.753 m)    Wt 177 lb (80.3 kg)    BMI 26.14 kg/m  Physical Exam Gen: NAD, alert, cooperative with exam, well-appearing MSK:  Neurovascularly intact    Aspiration/Injection Procedure Note Stanley Freeman 08-12-1961  Procedure: Injection Indications: left knee pain   Procedure Details Consent: Risks of procedure as well as the alternatives and risks of each were explained to the (patient/caregiver).  Consent for procedure obtained. Time Out: Verified patient identification, verified procedure, site/side was marked, verified correct patient position, special equipment/implants available, medications/allergies/relevent history reviewed, required imaging and test results available.  Performed.  The area was cleaned with iodine and alcohol swabs.    The left knee superior lateral  suprapatellar pouch was injected using 3 cc's of 1% lidocaine with a 22 1 1/2" needle.  The syringe was switched and 6 cc of PRP was injected.  Ultrasound was used. Images were obtained in long views showing the injection.  A sterile dressing was applied.  Patient did tolerate procedure well.    ASSESSMENT & PLAN:   OA (osteoarthritis) of knee Completed PRP injection today of the left knee.

## 2021-10-10 NOTE — Patient Instructions (Addendum)
Good to see you  Please send me a message in MyChart with any questions or updates.  Please see me back in 6 weeks.   --Dr. Raeford Razor  Post procedure Instructions:   NO ice for 3 days. NO anti-inflammatories.   You can use tylenol for pain.   Please use crutches for 5 days post injections.   Day 6-14: you can start activities of daily living. Use pain as your guide.   Day 14-21: you can start walks or ride a bike or use an elliptical   Day 21-28: no hiking yet. May start resistance training and lifting.   Day 28-35: slowly returning to normal activities

## 2021-10-10 NOTE — Assessment & Plan Note (Signed)
Completed PRP injection today of the left knee.

## 2021-10-20 ENCOUNTER — Other Ambulatory Visit: Payer: Self-pay | Admitting: Family Medicine

## 2021-10-20 DIAGNOSIS — E7800 Pure hypercholesterolemia, unspecified: Secondary | ICD-10-CM

## 2021-12-01 ENCOUNTER — Ambulatory Visit (INDEPENDENT_AMBULATORY_CARE_PROVIDER_SITE_OTHER): Payer: 59 | Admitting: Family Medicine

## 2021-12-01 DIAGNOSIS — M1712 Unilateral primary osteoarthritis, left knee: Secondary | ICD-10-CM | POA: Diagnosis not present

## 2021-12-01 NOTE — Assessment & Plan Note (Signed)
Has done well with the PRP injection but does get pain in the medial compartment with strenuous exercise. ?-Counseled on home exercise therapy and supportive care. ?-Pursue Zilretta injection. ?

## 2021-12-01 NOTE — Progress Notes (Signed)
?  Stanley Freeman - 61 y.o. male MRN [id number]  Date of birth: 05-05-1961 ? ?SUBJECTIVE:  Including CC & ROS.  ?No chief complaint on file. ? ? ?Stanley Freeman is a 61 y.o. male that is following up for his left knee pain.  He has got improvement with the PRP injection.  He still has some soreness in the medial compartment. ? ? ?Review of Systems ?See HPI  ? ?HISTORY: Past Medical, Surgical, Social, and Family History Reviewed & Updated per EMR.   ?Pertinent Historical Findings include: ? ?Past Medical History:  ?Diagnosis Date  ? Allergy   ? GERD (gastroesophageal reflux disease)   ? SEV YRS AGO- OCC HEARTBURM- PRN OTC USE   ? Hemorrhoids   ? Hyperlipidemia   ? PONV (postoperative nausea and vomiting)   ? Seizures ([place])   ? 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 Testosterone deficiency   ? ? ?Past Surgical History:  ?Procedure Laterality Date  ? ANKLE SURGERY    ? BACK SURGERY    ? DISC  ? COLONOSCOPY    ? IN [place] 10 YRS AGO- NORMAL PER PT   ? HEMORROID BANDING    ? X 2 WITH HELP PER PT   ? INGUINAL HERNIA REPAIR    ? MANDIBLE SURGERY    ? UMBILICAL HERNIA REPAIR    ? VASECTOMY    ? ? ? ?PHYSICAL EXAM:  ?VS: BP 120/80   Ht '5\' 9"'$  (1.753 m)   Wt 170 lb (77.1 kg)   BMI 25.10 kg/m?  ?Physical Exam ?Gen: NAD, alert, cooperative with exam, well-appearing ?MSK:  ?Neurovascularly intact   ? ? ? ? ?ASSESSMENT & PLAN:  ? ?OA (osteoarthritis) of knee ?Has done well with the PRP injection but does get pain in the medial compartment with strenuous exercise. ?-Counseled on home exercise therapy and supportive care. ?-Pursue Zilretta injection. ? ? ? ? ?

## 2021-12-02 ENCOUNTER — Ambulatory Visit (INDEPENDENT_AMBULATORY_CARE_PROVIDER_SITE_OTHER): Payer: 59 | Admitting: Family Medicine

## 2021-12-02 ENCOUNTER — Encounter: Payer: Self-pay | Admitting: Family Medicine

## 2021-12-02 ENCOUNTER — Ambulatory Visit: Payer: Self-pay

## 2021-12-02 DIAGNOSIS — M1712 Unilateral primary osteoarthritis, left knee: Secondary | ICD-10-CM | POA: Diagnosis not present

## 2021-12-02 NOTE — Assessment & Plan Note (Signed)
Completed zilretta injection today  

## 2021-12-02 NOTE — Progress Notes (Signed)
?  Stanley Freeman - 61 y.o. male MRN [id number]  Date of birth: Jul 03, 1961 ? ?SUBJECTIVE:  Including CC & ROS.  ?No chief complaint on file. ? ? ?Stanley Freeman is a 61 y.o. male that is  here for Zilretta injection. ? ? ? ?Review of Systems ?See HPI  ? ?HISTORY: Past Medical, Surgical, Social, and Family History Reviewed & Updated per EMR.   ?Pertinent Historical Findings include: ? ?Past Medical History:  ?Diagnosis Date  ? Allergy   ? GERD (gastroesophageal reflux disease)   ? SEV YRS AGO- OCC HEARTBURM- PRN OTC USE   ? Hemorrhoids   ? Hyperlipidemia   ? PONV (postoperative nausea and vomiting)   ? Seizures ([place])   ? 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 Testosterone deficiency   ? ? ?Past Surgical History:  ?Procedure Laterality Date  ? ANKLE SURGERY    ? BACK SURGERY    ? DISC  ? COLONOSCOPY    ? IN [place] 10 YRS AGO- NORMAL PER PT   ? HEMORROID BANDING    ? X 2 WITH HELP PER PT   ? INGUINAL HERNIA REPAIR    ? MANDIBLE SURGERY    ? UMBILICAL HERNIA REPAIR    ? VASECTOMY    ? ? ? ?PHYSICAL EXAM:  ?VS: BP 114/80 (BP Location: Left Arm, Patient Position: Sitting)   Ht '5\' 9"'$  (1.753 m)   Wt 170 lb (77.1 kg)   BMI 25.10 kg/m?  ?Physical Exam ?Gen: NAD, alert, cooperative with exam, well-appearing ?MSK:  ?Neurovascularly intact   ? ? ?Aspiration/Injection Procedure Note ?[name] ?12/21/1960 ? ?Procedure: Injection ?Indications: left knee pain  ? ?Procedure Details ?Consent: Risks of procedure as well as the alternatives and risks of each were explained to the (patient/caregiver).  Consent for procedure obtained. ?Time Out: Verified patient identification, verified procedure, site/side was marked, verified correct patient position, special equipment/implants available, medications/allergies/relevent history reviewed, required imaging and test results available.  Performed.  The area was cleaned with iodine and alcohol swabs.   ? ?The left knee superior lateral suprapatellar pouch  was injected using 3 cc of 1% lidocaine on a 22-gauge 1-1/2 inch needle.   The syringe was switched and a mixture containing 5 cc's of 32 mg Zilretta and 4 cc's of 0.25% bupivacaine was injected.  Ultrasound was used. Images were obtained in long views showing the injection.  ? ? ?A sterile dressing was applied. ? ?Patient did tolerate procedure well. ? ? ?ASSESSMENT & PLAN:  ? ?OA (osteoarthritis) of knee ?Completed zilretta injection today  ? ? ? ? ?

## 2021-12-02 NOTE — Patient Instructions (Signed)
Good to see you °Please use ice as needed   °Please send me a message in MyChart with any questions or updates.  °Please see me back in 4-6 weeks or as needed if better.  ° °--Dr. Heith Haigler ° °

## 2022-03-12 ENCOUNTER — Ambulatory Visit: Payer: 59 | Admitting: Neurology

## 2022-03-16 ENCOUNTER — Other Ambulatory Visit: Payer: Self-pay | Admitting: Family Medicine

## 2022-03-16 DIAGNOSIS — J302 Other seasonal allergic rhinitis: Secondary | ICD-10-CM

## 2022-03-19 ENCOUNTER — Other Ambulatory Visit: Payer: Self-pay | Admitting: Neurology

## 2022-03-19 DIAGNOSIS — R569 Unspecified convulsions: Secondary | ICD-10-CM

## 2022-03-31 ENCOUNTER — Encounter: Payer: Self-pay | Admitting: Family Medicine

## 2022-04-02 ENCOUNTER — Other Ambulatory Visit: Payer: Self-pay | Admitting: Neurology

## 2022-04-06 ENCOUNTER — Telehealth: Payer: Self-pay

## 2022-04-06 NOTE — Telephone Encounter (Signed)
PA sent for Ubrelvy '50MG'$  tablets on CMM Key: [id number] - PA Case ID: [id number] - Rx #: [id number] PENDING

## 2022-04-06 NOTE — Telephone Encounter (Signed)
Approved through 04/07/2023.

## 2022-04-07 ENCOUNTER — Ambulatory Visit: Payer: 59 | Admitting: Family Medicine

## 2022-04-07 ENCOUNTER — Encounter: Payer: Self-pay | Admitting: Family Medicine

## 2022-04-07 VITALS — BP 110/72 | Ht 69.0 in | Wt 170.0 lb

## 2022-04-07 VITALS — BP 102/68 | HR 74 | Temp 97.7°F | Ht 69.0 in | Wt 178.2 lb

## 2022-04-07 DIAGNOSIS — T753XXA Motion sickness, initial encounter: Secondary | ICD-10-CM | POA: Diagnosis not present

## 2022-04-07 DIAGNOSIS — M1712 Unilateral primary osteoarthritis, left knee: Secondary | ICD-10-CM | POA: Diagnosis not present

## 2022-04-07 MED ORDER — SCOPOLAMINE 1 MG/3DAYS TD PT72: 1.0000 | MEDICATED_PATCH | TRANSDERMAL | 1 refills | Status: DC

## 2022-04-07 NOTE — Patient Instructions (Signed)
Use GoodRx for the patches if there are cost issues.  Have a great time on your cruise.   Let us know if you need anything.

## 2022-04-07 NOTE — Progress Notes (Signed)
  Stanley Freeman - 61 y.o. male MRN [id number]  Date of birth: September 09, 1961  SUBJECTIVE:  Including CC & ROS.  No chief complaint on file.   Stanley Freeman is a 61 y.o. male that is presenting with acute on chronic left knee pain.  We have tried PRP, gel injections in the right hip and the left knee.  Acutely getting worse over the past few weeks.   Review of Systems See HPI   HISTORY: Past Medical, Surgical, Social, and Family History Reviewed & Updated per EMR.   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    Testosterone deficiency     Past Surgical History:  Procedure Laterality Date   ANKLE SURGERY     BACK SURGERY     DISC   COLONOSCOPY     IN [place] 10 YRS AGO- NORMAL PER PT    HEMORROID BANDING     X 2 WITH HELP PER PT    INGUINAL HERNIA REPAIR     MANDIBLE SURGERY     UMBILICAL HERNIA REPAIR     VASECTOMY       PHYSICAL EXAM:  VS: BP 110/72 (BP Location: Left Arm, Patient Position: Sitting)   Ht '5\' 9"'$  (1.753 m)   Wt 170 lb (77.1 kg)   BMI 25.10 kg/m  Physical Exam Gen: NAD, alert, cooperative with exam, well-appearing MSK:  Neurovascularly intact       ASSESSMENT & PLAN:   OA (osteoarthritis) of knee Acute on chronic in nature.  Has tried different forms of injections and pain has returned.  MRI was completed last year. -Counseled on home exercise therapy and supportive care. -Referral to orthopedic surgeon.

## 2022-04-07 NOTE — Progress Notes (Signed)
Chief Complaint  Patient presents with   seasick patches    Subjective: Patient is a 61 y.o. male here for seasick patches.  Pt going on a cruise on Aug 6 for 7 d. He has hx of getting motion sickness. He has been on scopolamine patches in the past that have worked well. He does not remember any adverse effects, may have had a dry mouth.   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 Use GoodRx if cost issues. I did print this for him as well in case he wishes to fill at another pharmacy. F/u as originally scheduled or prn.  The patient voiced understanding and agreement to the plan.  [place], DO 04/07/22  3:39 PM

## 2022-04-07 NOTE — Assessment & Plan Note (Signed)
Acute on chronic in nature.  Has tried different forms of injections and pain has returned.  MRI was completed last year. -Counseled on home exercise therapy and supportive care. -Referral to orthopedic surgeon.

## 2022-04-07 NOTE — Patient Instructions (Signed)
Good to see you  Please send me a message in MyChart with any questions or updates.  Please see me back as needed.   --Dr. Tadarius Maland  

## 2022-04-16 ENCOUNTER — Ambulatory Visit: Payer: 59 | Admitting: Neurology

## 2022-04-16 ENCOUNTER — Encounter: Payer: Self-pay | Admitting: Neurology

## 2022-04-16 DIAGNOSIS — G43909 Migraine, unspecified, not intractable, without status migrainosus: Secondary | ICD-10-CM

## 2022-04-16 DIAGNOSIS — R569 Unspecified convulsions: Secondary | ICD-10-CM | POA: Diagnosis not present

## 2022-04-16 MED ORDER — LAMOTRIGINE 200 MG PO TABS: 200.0000 mg | ORAL_TABLET | Freq: Two times a day (BID) | ORAL | 4 refills | Status: DC

## 2022-04-16 MED ORDER — BUTALBITAL-ASPIRIN-CAFFEINE 50-325-40 MG PO CAPS: 2.0000 | ORAL_CAPSULE | Freq: Two times a day (BID) | ORAL | 5 refills | Status: DC | PRN

## 2022-04-16 MED ORDER — TOPIRAMATE 100 MG PO TABS: 100.0000 mg | ORAL_TABLET | Freq: Two times a day (BID) | ORAL | 4 refills | Status: DC

## 2022-04-16 NOTE — Progress Notes (Signed)
Patient: Stanley Freeman Date of Birth: 1960-10-14  Reason for Visit: Follow up History from: Patient Primary Neurologist: Krista Blue  ASSESSMENT AND PLAN 61 y.o. year old male   82.  Complex partial seizure -Last seizure was in 2005 -Wishes to continue current medication regimen  -Continue Lamictal 200 mg twice a day  -Continue Topamax 100 mg twice a day  2.  Chronic migraine headache -Continue Ubrelvy 50 mg as needed for acute severe headache; continue Fiorinal as needed for moderate headache  Meds ordered this encounter  Medications   butalbital-aspirin-caffeine (FIORINAL) 50-325-40 MG capsule    Sig: Take 2 capsules by mouth 2 (two) times daily as needed for headache.    Dispense:  12 capsule    Refill:  5   topiramate (TOPAMAX) 100 MG tablet    Sig: Take 1 tablet (100 mg total) by mouth 2 (two) times daily.    Dispense:  180 tablet    Refill:  4    Please hold for refills on file, patient will request when needed   lamoTRIgine (LAMICTAL) 200 MG tablet    Sig: Take 1 tablet (200 mg total) by mouth 2 (two) times daily.    Dispense:  180 tablet    Refill:  4    Please hold as refills on file    HISTORY Shaun Runyon is a 61 year old male, seen in request by his primary care physician Dr. Nani Ravens, [place], for evaluation of seizure, initial evaluation was on Jan 29, 2020.   I have reviewed and summarized the referring note from the referring physician.  He had a past medical history of hypertension, hyperlipidemia, reported history of seizures since 1984.   He suffered his first nocturnal generalized seizure at age 64 in 5, had his second witnessed nocturnal seizure in 1985, reported normal MRI of the brain, EEG at that time, he was treated with Dilantin, since Dilantin treatment, he no longer have generalized seizure   However, he continue complains of intermittent morning time dj vu, he remembered while he was shaving, he has this waves of weird sensation  throughout his body, last less than 1 minute, mild confusion afterwards, there was no tonic-clonic shaking, he was followed by his primary care physician during that period of time, around his 30s, stayed on Dilantin.  He had recurrent partial seizure, about once or twice every months.   He began to seek attention from neurologist after he settled in [place] in 2001, was seen by a neurologist Dr. 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   Laboratory evaluation April 2021, normal CMP, lipid panel, LDL 140, negative HIV, hepatitis C   UPDATE March 06 2021: reviewed his neurological clinic evaluation from [facility] dated December 26, 2018 by Dr. 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   Seizure started since  1984, idiopathic, MRI of the brain was in 2000 02 that was normal, EEG was normal lamotrigine 200 mg twice a day, Topamax 100 mg twice a day   Last reported seizure was in 2005   Is overall doing very well, no recurrent seizure, continue to work full-time job, is taking Topamax 100 mg twice a day, lamotrigine 200 mg twice a day, despite last seizure was many years ago, he does not want to make any medication changes at this time, worried it might interrupt his routine, working capacity  He  complains of headache all his life, 6 out of 10 holoacranial pressure sometimes throbbing headache with light noise sensitivity, movement made it worse, it can last 1 day or even longer, over-the-counter Tylenol, NSAIDs does not help, caffeine containing beverages was helpful, Fioricet as needed was helpful, his headache overall has much improved, but still having 1-2 times each week   Update 04/16/2022 SS: Planning to retire in 2024. Will probably move close to family. Last seizure was in 2005. Remains on Lamictal 200 mg twice daily, Topamax 100 mg twice daily. Headaches under good control, mostly just taking 2 Tylenol. Melburn Hake is 80% effective with tablet. Can go 2 months without needing, other times 2 a week. Never takes all of monthly supply. Has Fiorinal for moderate headaches.   REVIEW OF SYSTEMS: Out of a complete 14 system review of symptoms, the patient complains only of the following symptoms, and all other reviewed systems are negative.  See HPI  ALLERGIES: No Known Allergies  HOME MEDICATIONS: Outpatient Medications Prior to Visit  Medication Sig Dispense Refill   azelastine (ASTELIN) 0.1 % nasal spray Place 2 sprays into both nostrils 2 (two) times daily. Use in each nostril as directed 90 mL 3   cetirizine (ZYRTEC) 10 MG tablet Take 10 mg by mouth daily.     clotrimazole-betamethasone (LOTRISONE) cream Apply 1 application topically 2 (two) times daily. 30 g 2   fluticasone (FLONASE) 50 MCG/ACT nasal spray Place into both nostrils daily.     montelukast (SINGULAIR) 10 MG tablet TAKE 1 TABLET BY MOUTH AT  BEDTIME 90 tablet 3   rosuvastatin (CRESTOR) 20 MG tablet TAKE 1 TABLET BY MOUTH  DAILY 90 tablet 3   scopolamine (TRANSDERM-SCOP) 1 MG/3DAYS Place 1 patch (1.5 mg total) onto the skin every 3 (three) days. 4 patch 1   Testosterone 1.62 % GEL APPLY 3 PUMPS DAILY AS DIRECTED     Ubrogepant (UBRELVY) 50 MG TABS TAKE 1 TABLET BY MOUTH AT ONSET OF MIGRAINE. MAY REPEAT IN 2 HOURS,  AS NEEDED. MAX DOSE: 2 TABLETS DAILY. THIS IS A 30 DAY PRESCRIPTION 8 tablet 2   butalbital-aspirin-caffeine (FIORINAL) 50-325-40 MG capsule Take 2 capsules by mouth 2 (two) times daily as needed for headache. 36 capsule 3   lamoTRIgine (LAMICTAL) 200 MG tablet TAKE 1 TABLET BY MOUTH  TWICE DAILY 180 tablet 0   topiramate (TOPAMAX) 100 MG tablet TAKE 1 TABLET BY MOUTH  TWICE DAILY 180 tablet 0   No facility-administered medications prior to visit.    PAST MEDICAL HISTORY: Past Medical History:  Diagnosis Date   Allergy    GERD (gastroesophageal reflux disease)    SEV YRS AGO- OCC HEARTBURM- PRN OTC USE    Hemorrhoids    Hyperlipidemia    PONV (postoperative nausea and vomiting)    Seizures ([place])    ON TOPAMAX AND LAMICTAL- LAST 2005 SEIZURES- GRAND MALX 2 IN EARLY 20'S    Testosterone deficiency  PAST SURGICAL HISTORY: Past Surgical History:  Procedure Laterality Date   ANKLE SURGERY     BACK SURGERY     DISC   COLONOSCOPY     IN [place] 10 YRS AGO- NORMAL PER PT    HEMORROID BANDING     X 2 WITH HELP PER PT    INGUINAL HERNIA REPAIR     MANDIBLE SURGERY     UMBILICAL HERNIA REPAIR     VASECTOMY      FAMILY HISTORY: Family History  Problem Relation Age of Onset   Diverticulitis Mother    Colon polyps Mother    Dementia Father    Colon cancer Neg Hx    Esophageal cancer Neg Hx    Rectal cancer Neg Hx    Stomach cancer Neg Hx     SOCIAL HISTORY: Social History   Socioeconomic History   Marital status: Married    Spouse name: Not on file   Number of children: Not on file   Years of education: Not on file   Highest education level: Not on file  Occupational History   Not on file  Tobacco Use   Smoking status: Never   Smokeless tobacco: Never  Vaping Use   Vaping Use: Never used  Substance and Sexual Activity   Alcohol use: Yes    Alcohol/week: 5.0 - 10.0 standard drinks of alcohol    Types: 5 - 10 Standard drinks or equivalent per week    Comment:  SOCIAL 1-2 DRINKS A FEW DAYS A WEEK    Drug use: Never   Sexual activity: Not on file  Other Topics Concern   Not on file  Social History Narrative   Caffeine: avg 3 cups/day   Right handed   Social Determinants of Health   Financial Resource Strain: Not on file  Food Insecurity: Not on file  Transportation Needs: Not on file  Physical Activity: Not on file  Stress: Not on file  Social Connections: Not on file  Intimate Partner Violence: Not on file   PHYSICAL EXAM  Vitals:   04/16/22 1525  BP: 117/79  Pulse: 68  Weight: 173 lb (78.5 kg)  Height: '5\' 9"'$  (1.753 m)   Body mass index is 25.55 kg/m.  Generalized: Well developed, in no acute distress  Neurological examination  Mentation: Alert oriented to time, place, history taking. Follows all commands speech and language fluent Cranial nerve II-XII: Pupils were equal round reactive to light. Extraocular movements were full, visual field were full on confrontational test. Facial sensation and strength were normal.  Head turning and shoulder shrug  were normal and symmetric. Motor: The motor testing reveals 5 over 5 strength of all 4 extremities. Good symmetric motor tone is noted throughout.  Sensory: Sensory testing is intact to soft touch on all 4 extremities. No evidence of extinction is noted.  Coordination: Cerebellar testing reveals good finger-nose-finger and heel-to-shin bilaterally.  Gait and station: Gait is normal. Tandem gait is normal.  Reflexes: Deep tendon reflexes are symmetric and normal bilaterally.   DIAGNOSTIC DATA (LABS, IMAGING, TESTING) - I reviewed patient records, labs, notes, testing and imaging myself where available.  Lab Results  Component Value Date   WBC 4.2 07/11/2021   HGB 15.5 07/11/2021   HCT 47.1 07/11/2021   MCV 93.4 07/11/2021   PLT 227.0 07/11/2021      Component Value Date/Time   NA 142 07/11/2021 0744   K 4.1 07/11/2021 0744   CL 108 07/11/2021 0744  CO2 25 07/11/2021 0744    GLUCOSE 89 07/11/2021 0744   BUN 22 07/11/2021 0744   CREATININE 1.50 07/11/2021 0744   CALCIUM 9.8 07/11/2021 0744   PROT 6.7 07/11/2021 0744   ALBUMIN 4.9 07/11/2021 0744   AST 15 07/11/2021 0744   ALT 15 07/11/2021 0744   ALKPHOS 60 07/11/2021 0744   BILITOT 0.6 07/11/2021 0744   Lab Results  Component Value Date   CHOL 156 07/11/2021   HDL 46.90 07/11/2021   LDLCALC 93 07/11/2021   TRIG 84.0 07/11/2021   CHOLHDL 3 07/11/2021   No results found for: "HGBA1C" No results found for: "VITAMINB12" No results found for: "TSH"  Butler Denmark, Laqueta Jean, DNP 04/16/2022, 3:48 PM [facility] [address] [place], [place] [address] [phone]

## 2022-06-01 IMAGING — DX DG KNEE COMPLETE 4+V*L*
4 series · 4 of 4 positions shown · non-contrast
Comparison: None.

CLINICAL DATA: Chronic left knee pain.

EXAM:
LEFT KNEE - COMPLETE 4+ VIEW

[knee ap]
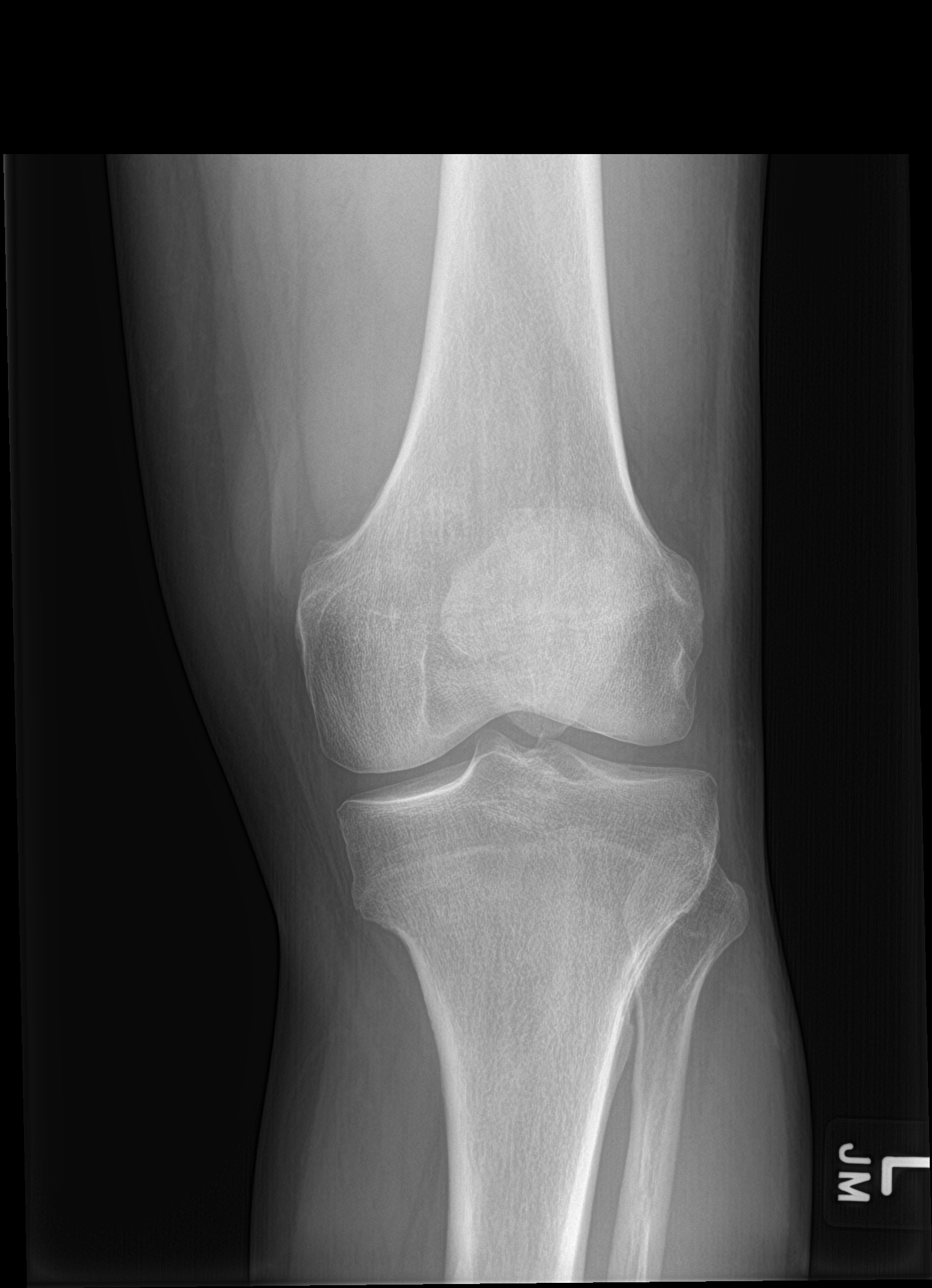

[tunnel]
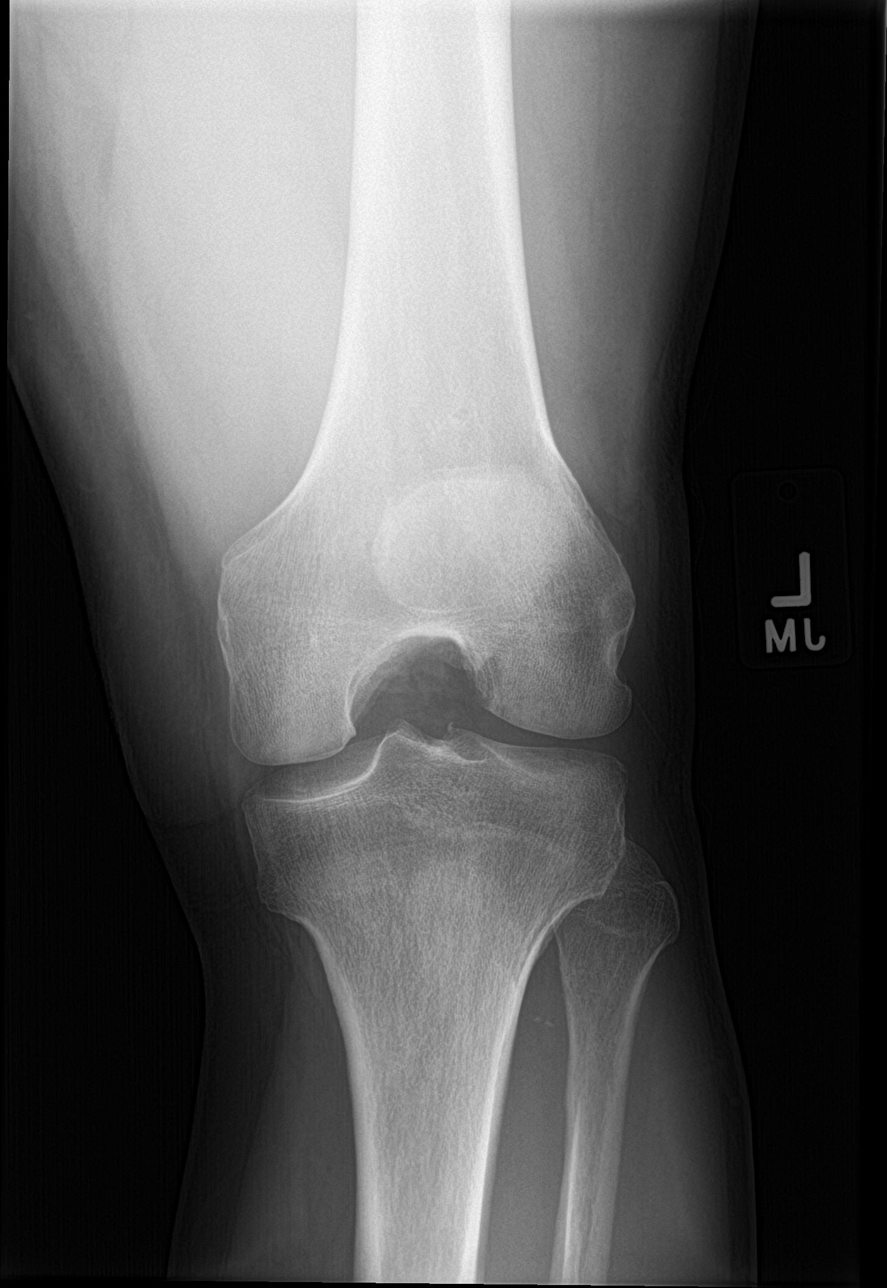

[knee lat]
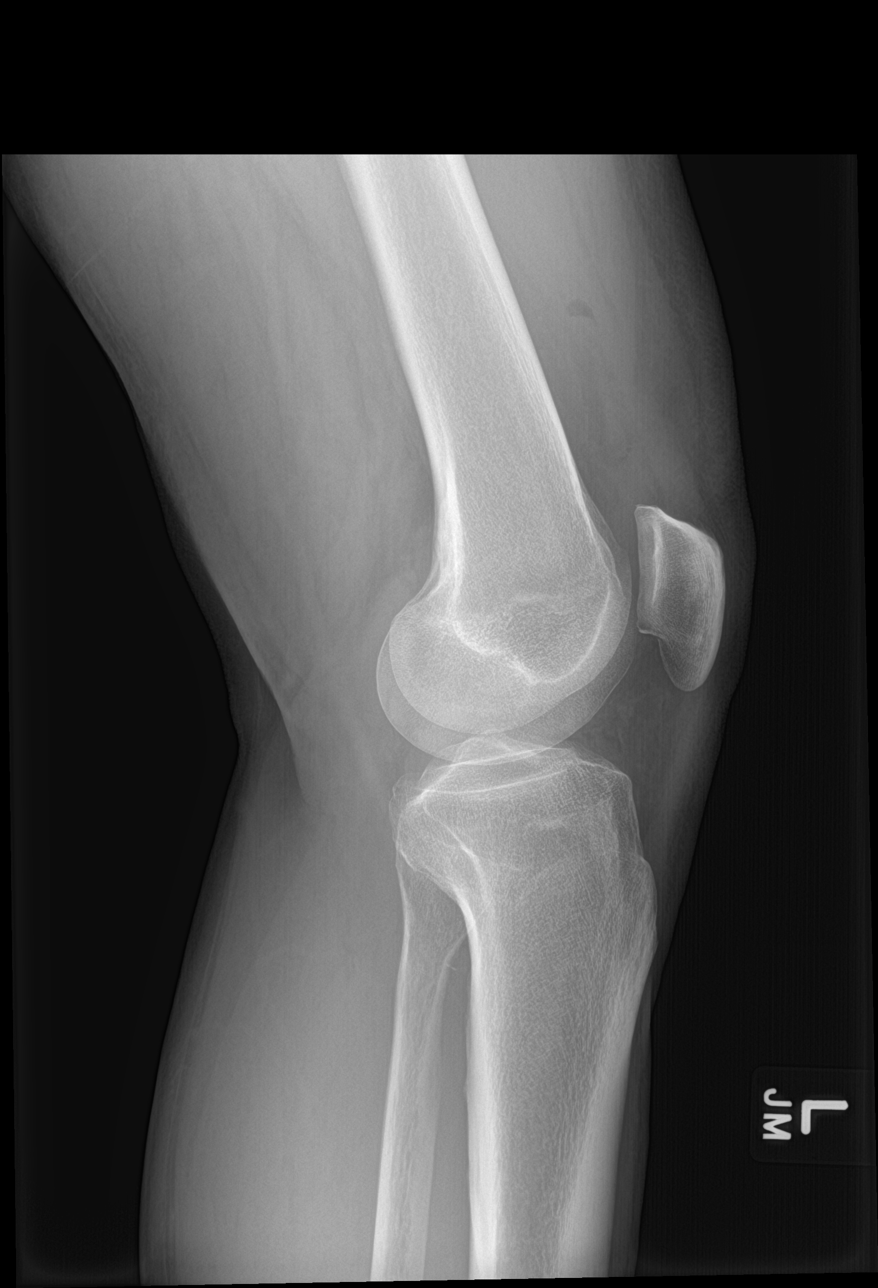

[knee sunrise]
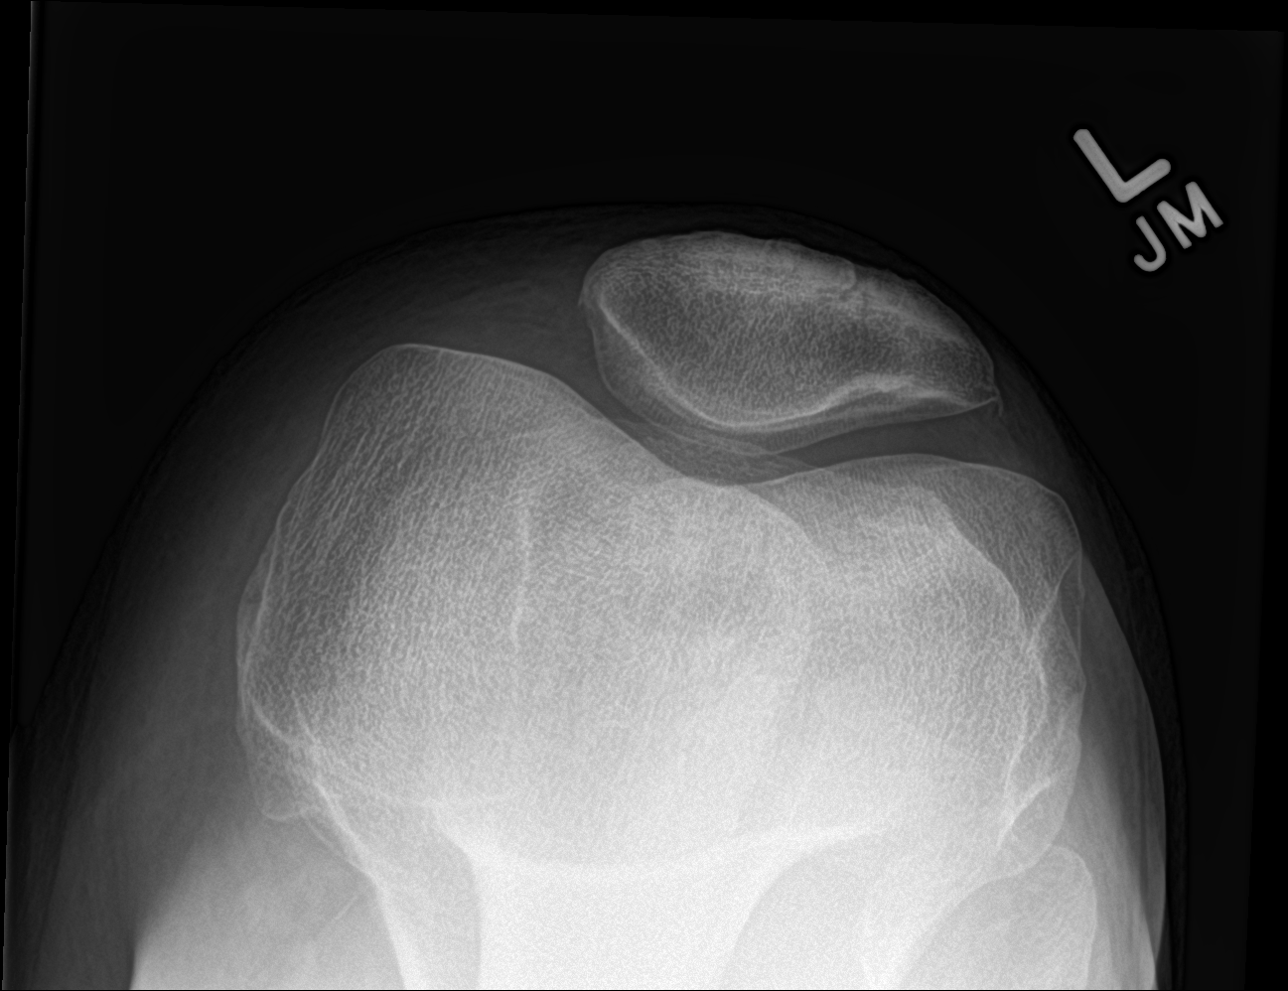

[4 of 4 positions shown; findings below may reference images not displayed]

FINDINGS: There is a small lipohemarthrosis. No obvious acute osseous
abnormality/fracture visualized. Minimal patellofemoral degenerative
spurring. Soft tissues are unremarkable.
IMPRESSION: 1. Small lipohemarthrosis. Finding which is suggestive of an
underlying fracture, although no obvious acute osseous
abnormality/fracture visualized. Recommend MRI of the knee for
further evaluation ASAP to evaluate for occult fracture.
2. Minimal patellofemoral degenerative spurring.

These results will be called to the ordering clinician or
representative by the Radiologist Assistant, and communication
documented in the PACS or [REDACTED].

## 2022-06-15 ENCOUNTER — Encounter: Payer: Self-pay | Admitting: Neurology

## 2022-06-15 ENCOUNTER — Other Ambulatory Visit: Payer: Self-pay

## 2022-06-15 DIAGNOSIS — R569 Unspecified convulsions: Secondary | ICD-10-CM

## 2022-06-15 DIAGNOSIS — G43909 Migraine, unspecified, not intractable, without status migrainosus: Secondary | ICD-10-CM

## 2022-06-15 MED ORDER — LAMOTRIGINE 200 MG PO TABS: 200.0000 mg | ORAL_TABLET | Freq: Two times a day (BID) | ORAL | 4 refills | Status: DC

## 2022-06-15 MED ORDER — TOPIRAMATE 100 MG PO TABS: 100.0000 mg | ORAL_TABLET | Freq: Two times a day (BID) | ORAL | 4 refills | Status: DC

## 2022-06-15 NOTE — Telephone Encounter (Signed)
Patient is asking for a change in pharmacy for topamax, lamictal, and fiorinal to [facility].  Topamax and lamictal sent.  [place] Controlled Substance Registry checked and is appropriate for fiorinal. Patient is up to date on his appointments.

## 2022-06-16 MED ORDER — BUTALBITAL-ASPIRIN-CAFFEINE 50-325-40 MG PO CAPS: 2.0000 | ORAL_CAPSULE | Freq: Two times a day (BID) | ORAL | 5 refills | Status: AC | PRN

## 2022-06-29 ENCOUNTER — Encounter: Payer: Self-pay | Admitting: Family Medicine

## 2022-06-29 ENCOUNTER — Ambulatory Visit: Payer: 59 | Admitting: Family Medicine

## 2022-06-29 VITALS — BP 108/72 | HR 87 | Temp 98.0°F | Ht 69.0 in | Wt 167.4 lb

## 2022-06-29 DIAGNOSIS — J0100 Acute maxillary sinusitis, unspecified: Secondary | ICD-10-CM | POA: Diagnosis not present

## 2022-06-29 MED ORDER — AZITHROMYCIN 250 MG PO TABS: ORAL_TABLET | ORAL | 0 refills | Status: DC

## 2022-06-29 MED ORDER — CLOTRIMAZOLE-BETAMETHASONE 1-0.05 % EX CREA: 1.0000 | TOPICAL_CREAM | Freq: Two times a day (BID) | CUTANEOUS | 2 refills | Status: AC

## 2022-06-29 NOTE — Patient Instructions (Signed)
Continue to push fluids, practice good hand hygiene, and cover your mouth if you cough. ? ?If you start having fevers, shaking or shortness of breath, seek immediate care. ? ?OK to take Tylenol 1000 mg (2 extra strength tabs) or 975 mg (3 regular strength tabs) every 6 hours as needed. ? ?Let us know if you need anything. ?

## 2022-06-29 NOTE — Progress Notes (Signed)
Chief Complaint  Patient presents with   Sinus Problem   Cough    Stanley Freeman here for URI complaints.  Duration: 3 weeks  Associated symptoms: sinus congestion, sinus pain, rhinorrhea, and coughing Denies: itchy watery eyes, ear pain, ear drainage, sore throat, wheezing, shortness of breath, myalgia, and fevers Treatment to date: INCS, Flonase, Singulair Sick contacts: No  Past Medical History:  Diagnosis Date   Allergy    GERD (gastroesophageal reflux disease)    SEV YRS AGO- OCC HEARTBURM- PRN OTC USE    Hemorrhoids    Hyperlipidemia    PONV (postoperative nausea and vomiting)    Seizures ([place])    ON TOPAMAX AND LAMICTAL- LAST 2005 SEIZURES- GRAND MALX 2 IN EARLY 20'S    Testosterone deficiency     Objective BP 108/72 (BP Location: Left Arm, Patient Position: Sitting, Cuff Size: Normal)   Pulse 87   Temp 98 F (36.7 C) (Oral)   Ht '5\' 9"'$  (1.753 m)   Wt 167 lb 6 oz (75.9 kg)   SpO2 97%   BMI 24.72 kg/m  General: Awake, alert, appears stated age HEENT: AT, [place], ears patent b/l and TM's neg, nares patent w/o discharge, pharynx pink and without exudates, MMM Neck: No masses or asymmetry Heart: RRR Lungs: CTAB, no accessory muscle use Psych: Age appropriate judgment and insight, normal mood and affect  Acute maxillary sinusitis, recurrence not specified - Plan: azithromycin (ZITHROMAX) 250 MG tablet  Continue to push fluids, practice good hand hygiene, cover mouth when coughing. 60 Xyzal capsules at [facility]. He usually mixes contents of capsules in water and rinses nasal passages with it.  F/u prn. If starting to experience fevers, shaking, or shortness of breath, seek immediate care. Pt voiced understanding and agreement to the plan.  Gonzales, DO 06/29/22 1:08 PM

## 2022-07-06 ENCOUNTER — Other Ambulatory Visit (HOSPITAL_BASED_OUTPATIENT_CLINIC_OR_DEPARTMENT_OTHER): Payer: Self-pay

## 2022-07-06 MED ORDER — FLUARIX QUADRIVALENT 0.5 ML IM SUSY
PREFILLED_SYRINGE | INTRAMUSCULAR | 0 refills | Status: DC
  Filled 2022-07-06: qty 0.5, 1d supply, fill #0

## 2022-07-06 MED ORDER — COMIRNATY 30 MCG/0.3ML IM SUSY
PREFILLED_SYRINGE | INTRAMUSCULAR | 0 refills | Status: DC
  Filled 2022-07-06: qty 0.3, 1d supply, fill #0

## 2022-07-21 ENCOUNTER — Ambulatory Visit: Payer: 59 | Admitting: Neurology

## 2022-08-28 ENCOUNTER — Encounter: Payer: Self-pay | Admitting: Family Medicine

## 2022-08-28 ENCOUNTER — Ambulatory Visit: Payer: 59 | Admitting: Family Medicine

## 2022-08-28 VITALS — BP 136/80 | Ht 69.0 in | Wt 160.0 lb

## 2022-08-28 DIAGNOSIS — M1711 Unilateral primary osteoarthritis, right knee: Secondary | ICD-10-CM | POA: Diagnosis not present

## 2022-08-28 NOTE — Assessment & Plan Note (Signed)
Acute on chronic in nature.  Previous MRI was demonstrating degenerative changes in the knee.  Has done well with the previous injection. -Counseled on home exercise therapy and supportive care. -Could consider Zilretta or gel injection.

## 2022-08-28 NOTE — Progress Notes (Signed)
  Lynell Kussman - 61 y.o. male MRN [id number]  Date of birth: 1961/02/12  SUBJECTIVE:  Including CC & ROS.  No chief complaint on file.   Acel Natzke is a 61 y.o. male that is presenting with acute on chronic right knee pain.  He has done well since his previous injection.    Review of Systems See HPI   HISTORY: Past Medical, Surgical, Social, and Family History Reviewed & Updated per EMR.   Pertinent Historical Findings include:  Past Medical History:  Diagnosis Date   Allergy    GERD (gastroesophageal reflux disease)    SEV YRS AGO- OCC HEARTBURM- PRN OTC USE    Hemorrhoids    Hyperlipidemia    PONV (postoperative nausea and vomiting)    Seizures ([place])    ON TOPAMAX AND LAMICTAL- LAST 2005 SEIZURES- GRAND MALX 2 IN EARLY 20'S    Testosterone deficiency     Past Surgical History:  Procedure Laterality Date   ANKLE SURGERY     BACK SURGERY     DISC   COLONOSCOPY     IN [place] 10 YRS AGO- NORMAL PER PT    HEMORROID BANDING     X 2 WITH HELP PER PT    INGUINAL HERNIA REPAIR     MANDIBLE SURGERY     UMBILICAL HERNIA REPAIR     VASECTOMY       PHYSICAL EXAM:  VS: BP 136/80   Ht '5\' 9"'$  (1.753 m)   Wt 160 lb (72.6 kg)   BMI 23.63 kg/m  Physical Exam Gen: NAD, alert, cooperative with exam, well-appearing MSK:  Neurovascularly intact       ASSESSMENT & PLAN:   OA knee Acute on chronic in nature.  Previous MRI was demonstrating degenerative changes in the knee.  Has done well with the previous injection. -Counseled on home exercise therapy and supportive care. -Could consider Zilretta or gel injection.

## 2022-09-02 ENCOUNTER — Encounter: Payer: Self-pay | Admitting: Family Medicine

## 2022-09-02 ENCOUNTER — Encounter: Payer: Self-pay | Admitting: Neurology

## 2022-09-02 MED ORDER — UBRELVY 50 MG PO TABS: ORAL_TABLET | ORAL | 2 refills | Status: DC

## 2022-09-09 ENCOUNTER — Encounter: Payer: Self-pay | Admitting: Family Medicine

## 2022-09-09 ENCOUNTER — Ambulatory Visit: Payer: 59 | Admitting: Family Medicine

## 2022-09-09 ENCOUNTER — Ambulatory Visit: Payer: Self-pay

## 2022-09-09 VITALS — BP 100/72 | Ht 69.0 in | Wt 160.0 lb

## 2022-09-09 DIAGNOSIS — M1711 Unilateral primary osteoarthritis, right knee: Secondary | ICD-10-CM | POA: Diagnosis not present

## 2022-09-09 MED ORDER — SODIUM HYALURONATE 60 MG/3ML IX PRSY
60.0000 mg | PREFILLED_SYRINGE | Freq: Once | INTRA_ARTICULAR | Status: AC
  Administered 2022-09-09: 60 mg via INTRA_ARTICULAR

## 2022-09-09 NOTE — Progress Notes (Signed)
  Stanley Freeman - 61 y.o. male MRN [id number]  Date of birth: Aug 07, 1961  SUBJECTIVE:  Including CC & ROS.  No chief complaint on file.   Stanley Freeman is a 61 y.o. male that is  here for a gel injection.    Review of Systems See HPI   HISTORY: Past Medical, Surgical, Social, and Family History Reviewed & Updated per EMR.   Pertinent Historical Findings include:  Past Medical History:  Diagnosis Date   Allergy    GERD (gastroesophageal reflux disease)    SEV YRS AGO- OCC HEARTBURM- PRN OTC USE    Hemorrhoids    Hyperlipidemia    PONV (postoperative nausea and vomiting)    Seizures ([place])    ON TOPAMAX AND LAMICTAL- LAST 2005 SEIZURES- GRAND MALX 2 IN EARLY 20'S    Testosterone deficiency     Past Surgical History:  Procedure Laterality Date   ANKLE SURGERY     BACK SURGERY     DISC   COLONOSCOPY     IN [place] 10 YRS AGO- NORMAL PER PT    HEMORROID BANDING     X 2 WITH HELP PER PT    INGUINAL HERNIA REPAIR     MANDIBLE SURGERY     UMBILICAL HERNIA REPAIR     VASECTOMY       PHYSICAL EXAM:  VS: BP 100/72 (BP Location: Left Arm, Patient Position: Sitting)   Ht '5\' 9"'$  (1.753 m)   Wt 160 lb (72.6 kg)   BMI 23.63 kg/m  Physical Exam Gen: NAD, alert, cooperative with exam, well-appearing MSK:  Neurovascularly intact     Aspiration/Injection Procedure Note Damien Batty [birth year]  Procedure: Injection Indications: right knee pain  Procedure Details Consent: Risks of procedure as well as the alternatives and risks of each were explained to the (patient/caregiver).  Consent for procedure obtained. Time Out: Verified patient identification, verified procedure, site/side was marked, verified correct patient position, special equipment/implants available, medications/allergies/relevent history reviewed, required imaging and test results available.  Performed.  The area was cleaned with iodine and alcohol swabs.    The right knee superior lateral suprapatellar pouch  was injected using 4 cc's of 1% lidocaine with a 22 1 1/2" needle.  The syringe was switched and 60 mg/40m of durolane was injected. Ultrasound was used. Images were obtained in  Long views showing the injection.    A sterile dressing was applied.  Patient did tolerate procedure well.    ASSESSMENT & PLAN:   OA knee Completed durolane injection

## 2022-09-09 NOTE — Assessment & Plan Note (Signed)
Completed durolane injection  

## 2022-09-09 NOTE — Patient Instructions (Signed)
Good to see you Please use ice as needed  Please send me a message in MyChart with any questions or updates.  Please see me back as needed.   --Dr. Iver Fehrenbach  

## 2022-10-01 ENCOUNTER — Encounter: Payer: Self-pay | Admitting: Family Medicine

## 2022-10-20 ENCOUNTER — Encounter: Payer: Self-pay | Admitting: Family Medicine

## 2022-10-20 ENCOUNTER — Ambulatory Visit: Payer: Self-pay

## 2022-10-20 ENCOUNTER — Ambulatory Visit (INDEPENDENT_AMBULATORY_CARE_PROVIDER_SITE_OTHER): Payer: 59 | Admitting: Family Medicine

## 2022-10-20 VITALS — BP 126/76 | Ht 69.0 in | Wt 160.0 lb

## 2022-10-20 DIAGNOSIS — M1711 Unilateral primary osteoarthritis, right knee: Secondary | ICD-10-CM

## 2022-10-20 MED ORDER — SODIUM HYALURONATE 60 MG/3ML IX PRSY
60.0000 mg | PREFILLED_SYRINGE | Freq: Once | INTRA_ARTICULAR | Status: AC
  Administered 2022-10-20: 60 mg via INTRA_ARTICULAR

## 2022-10-20 NOTE — Progress Notes (Signed)
  Stanley Freeman - 62 y.o. male MRN [id number]  Date of birth: 08-05-1961  SUBJECTIVE:  Including CC & ROS.  No chief complaint on file.   Stanley Freeman is a 61 y.o. male that is  here for a gel injection.     Review of Systems See HPI   HISTORY: Past Medical, Surgical, Social, and Family History Reviewed & Updated per EMR.   Pertinent Historical Findings include:  Past Medical History:  Diagnosis Date   Allergy    GERD (gastroesophageal reflux disease)    SEV YRS AGO- OCC HEARTBURM- PRN OTC USE    Hemorrhoids    Hyperlipidemia    PONV (postoperative nausea and vomiting)    Seizures ([place])    ON TOPAMAX AND LAMICTAL- LAST 2005 SEIZURES- GRAND MALX 2 IN EARLY 20'S    Testosterone deficiency     Past Surgical History:  Procedure Laterality Date   ANKLE SURGERY     BACK SURGERY     DISC   COLONOSCOPY     IN [place] 10 YRS AGO- NORMAL PER PT    HEMORROID BANDING     X 2 WITH HELP PER PT    INGUINAL HERNIA REPAIR     MANDIBLE SURGERY     UMBILICAL HERNIA REPAIR     VASECTOMY       PHYSICAL EXAM:  VS: BP 126/76   Ht '5\' 9"'$  (1.753 m)   Wt 160 lb (72.6 kg)   BMI 23.63 kg/m  Physical Exam Gen: NAD, alert, cooperative with exam, well-appearing MSK:  Neurovascularly intact     Aspiration/Injection Procedure Note Stanley Freeman 10-Feb-1961  Procedure: Injection Indications: right knee pain  Procedure Details Consent: Risks of procedure as well as the alternatives and risks of each were explained to the (patient/caregiver).  Consent for procedure obtained. Time Out: Verified patient identification, verified procedure, site/side was marked, verified correct patient position, special equipment/implants available, medications/allergies/relevent history reviewed, required imaging and test results available.  Performed.  The area was cleaned with iodine and alcohol swabs.    The right knee superior lateral suprapatellar pouch was injected using 4 cc's of 1% lidocaine with a  22 1 1/2" needle.  The syringe was switched and 60 mg/3 mL of durolane was injected. Ultrasound was used. Images were obtained in  Long views showing the injection.     A sterile dressing was applied.  Patient did tolerate procedure well.    ASSESSMENT & PLAN:   OA knee Completed gel injection

## 2022-10-20 NOTE — Assessment & Plan Note (Signed)
Completed gel injection

## 2022-10-20 NOTE — Patient Instructions (Signed)
Good to see you Please use ice as needed  Please send me a message in MyChart with any questions or updates.  Please see me back as needed.   --Dr. Costa Jha  

## 2022-10-31 ENCOUNTER — Other Ambulatory Visit: Payer: Self-pay | Admitting: Family Medicine

## 2022-10-31 DIAGNOSIS — E7800 Pure hypercholesterolemia, unspecified: Secondary | ICD-10-CM

## 2022-12-07 ENCOUNTER — Telehealth: Payer: Self-pay

## 2022-12-07 NOTE — Telephone Encounter (Signed)
Patient states he came in for Durolane and something happened to the syringe/needle so patient was to come back free of charge for a "second" injection   Patient was billed for $315 for the injection itself not the drug. patient was understanding that it was no charge at all but stated if the intent was to still bill for the doctors time than that is fine (just felt there was miscommunication there.) Patient just wanted clarification on that part.   Patient states that the 20611 code that he was charged for was not ran through insurance.   Patient was told for that part he would have to call billing and stated understanding, but would like a call back to discuss what the initial intent was for the no charge. (No charge all together or no charge just the medication).

## 2022-12-08 NOTE — Telephone Encounter (Signed)
Answered patients questions.   Rosemarie Ax, MD [facility] 12/08/2022, 2:08 PM

## 2022-12-30 ENCOUNTER — Other Ambulatory Visit: Payer: Self-pay | Admitting: Family Medicine

## 2022-12-30 DIAGNOSIS — J302 Other seasonal allergic rhinitis: Secondary | ICD-10-CM

## 2023-01-04 ENCOUNTER — Encounter: Payer: Self-pay | Admitting: *Deleted

## 2023-01-15 ENCOUNTER — Ambulatory Visit: Payer: 59 | Admitting: Family Medicine

## 2023-01-15 ENCOUNTER — Encounter: Payer: Self-pay | Admitting: Family Medicine

## 2023-01-15 VITALS — BP 120/82 | HR 76 | Temp 97.6°F | Ht 69.0 in | Wt 162.4 lb

## 2023-01-15 DIAGNOSIS — E7800 Pure hypercholesterolemia, unspecified: Secondary | ICD-10-CM

## 2023-01-15 DIAGNOSIS — J45909 Unspecified asthma, uncomplicated: Secondary | ICD-10-CM | POA: Insufficient documentation

## 2023-01-15 DIAGNOSIS — J4520 Mild intermittent asthma, uncomplicated: Secondary | ICD-10-CM | POA: Diagnosis not present

## 2023-01-15 MED ORDER — ALBUTEROL SULFATE HFA 108 (90 BASE) MCG/ACT IN AERS: 2.0000 | INHALATION_SPRAY | Freq: Four times a day (QID) | RESPIRATORY_TRACT | 1 refills | Status: AC | PRN

## 2023-01-15 NOTE — Progress Notes (Signed)
Chief Complaint  Patient presents with   Allergies    Needs to update his inhaler prescription     Subjective: Breathing/allergies Hx of allergy induced asthma/breathing issues. Has been having issues breathing and chest tightness after walking lately. Compliant with Flonase, Zyrtec and montelukast. No fevers, swelling, wheezing or coughing.   Hyperlipidemia Patient presents for Hyperlipidemia follow up. Currently taking Crestor 20 mg/d and compliance with treatment thus far has been good. He denies myalgias. He is adhering to a healthy diet. Exercise:  The patient is not known to have coexisting coronary artery disease.  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    Testosterone deficiency     Objective: BP 120/82 (BP Location: Left Arm, Patient Position: Sitting, Cuff Size: Normal)   Pulse 76   Temp 97.6 F (36.4 C) (Oral)   Ht 5\' 9"  (1.753 m)   Wt 162 lb 6 oz (73.7 kg)   SpO2 98%   BMI 23.98 kg/m  General: Awake, appears stated age HEENT: MMM Heart: RRR, no LE edema, no bruits Lungs: CTAB, no rales, wheezes or rhonchi. No accessory muscle use Psych: Age appropriate judgment and insight, normal affect and mood  Assessment and Plan: Mild intermittent extrinsic asthma without complication - Plan: albuterol (VENTOLIN HFA) 108 (90 Base) MCG/ACT inhaler  Pure hypercholesterolemia - Plan: Comprehensive metabolic panel, Lipid panel  Chronic, not currently controlled. Cont Singulair 10 mg/d, INCS, Zyrtec. Add back SABA. Usually uses intermittently, has not had it filled for a few years.  Chronic, stable. Cont Crestor 20 mg/d. Ck labs today. Counseled on diet/exercise.  F/u in 6 mo or prn. The patient voiced  understanding and agreement to the plan.  Jilda Roche [place], DO 01/15/23  4:26 PM

## 2023-01-15 NOTE — Patient Instructions (Signed)
Give us 2-3 business days to get the results of your labs back.   Keep the diet clean and stay active.  Let us know if you need anything. 

## 2023-01-16 LAB — LIPID PANEL
Cholesterol: 153 mg/dL (ref <200)
HDL: 50 mg/dL (ref >=40)
LDL Cholesterol (Calc): 86 mg/dL (calc)
Non-HDL Cholesterol (Calc): 103 mg/dL (calc) (ref <130)
Total CHOL/HDL Ratio: 3.1 (calc) (ref <5.0)
Triglycerides: 78 mg/dL (ref <150)

## 2023-01-16 LAB — COMPREHENSIVE METABOLIC PANEL
AG Ratio: 2.8 (calc) — ABNORMAL HIGH (ref 1.0–2.5)
ALT: 26 U/L (ref 9–46)
AST: 22 U/L (ref 10–35)
Albumin: 4.8 g/dL (ref 3.6–5.1)
Alkaline phosphatase (APISO): 57 U/L (ref 35–144)
BUN/Creatinine Ratio: 11 (calc) (ref 6–22)
BUN: 16 mg/dL (ref 7–25)
CO2: 24 mmol/L (ref 20–32)
Calcium: 9.7 mg/dL (ref 8.6–10.3)
Chloride: 106 mmol/L (ref 98–110)
Creat: 1.43 mg/dL — ABNORMAL HIGH (ref 0.70–1.35)
Globulin: 1.7 g/dL (calc) — ABNORMAL LOW (ref 1.9–3.7)
Glucose, Bld: 88 mg/dL (ref 65–99)
Potassium: 4 mmol/L (ref 3.5–5.3)
Sodium: 141 mmol/L (ref 135–146)
Total Bilirubin: 0.6 mg/dL (ref 0.2–1.2)
Total Protein: 6.5 g/dL (ref 6.1–8.1)

## 2023-03-20 ENCOUNTER — Other Ambulatory Visit (HOSPITAL_COMMUNITY): Payer: Self-pay

## 2023-03-20 ENCOUNTER — Telehealth: Payer: Self-pay

## 2023-03-20 NOTE — Telephone Encounter (Signed)
Pharmacy Patient Advocate Encounter   Received notification from [employer] that prior authorization for Ubrelvy 50MG  tablets is required/requested.   PA submitted to [facility] via CoverMyMeds Key or [facility]) confirmation # [id number]  Status is pending

## 2023-03-22 ENCOUNTER — Encounter: Payer: Self-pay | Admitting: Neurology

## 2023-03-22 ENCOUNTER — Other Ambulatory Visit: Payer: Self-pay | Admitting: Anesthesiology

## 2023-03-22 DIAGNOSIS — R569 Unspecified convulsions: Secondary | ICD-10-CM

## 2023-03-22 MED ORDER — LAMOTRIGINE 200 MG PO TABS: 200.0000 mg | ORAL_TABLET | Freq: Two times a day (BID) | ORAL | 4 refills | Status: DC

## 2023-03-22 NOTE — Telephone Encounter (Signed)
Please see other message.

## 2023-03-22 NOTE — Telephone Encounter (Signed)
Pharmacy Patient Advocate Encounter  Prior Authorization for Ubrelvy 50MG  tablets has been APPROVED by [facility] from 03/20/2023 to 03/19/2024.   PA # PA Case ID: [id number]

## 2023-04-19 ENCOUNTER — Telehealth: Payer: 59 | Admitting: Neurology

## 2023-04-19 DIAGNOSIS — G40301 Generalized idiopathic epilepsy and epileptic syndromes, not intractable, with status epilepticus: Secondary | ICD-10-CM

## 2023-04-19 DIAGNOSIS — R569 Unspecified convulsions: Secondary | ICD-10-CM | POA: Diagnosis not present

## 2023-04-19 DIAGNOSIS — G43709 Chronic migraine without aura, not intractable, without status migrainosus: Secondary | ICD-10-CM

## 2023-04-19 MED ORDER — UBRELVY 50 MG PO TABS: ORAL_TABLET | ORAL | 11 refills | Status: AC

## 2023-04-19 NOTE — Patient Instructions (Signed)
Great to see you today!!  Congratulations on your retirement and upcoming move.  Please let me know if we can help with anything.  I will be happy to refill your  medications before you leave.  Keep me posted.  Good luck!  Thanks!!

## 2023-04-19 NOTE — Progress Notes (Signed)
   Virtual Visit via Video Note  I connected with Sharla Kidney on 04/19/23 at  3:15 PM EDT by a video enabled telemedicine application and verified that I am speaking with the correct person using two identifiers.  Location: Patient: at his home  Provider: in the office    I discussed the limitations of evaluation and management by telemedicine and the availability of in person appointments. The patient expressed understanding and agreed to proceed.  History of Present Illness: Today, April 19, 2023 SS: Via [employer]. Retired in May. Doing well. Has been doing a lot of traveling. Going to move to [place] in September. Remains on Lamictal and Topamax for seizures. Migraines are sporadic, mild headache few times a week, Tylenol takes care of it. Migraine 1-2 a month, Bernita Raisin works well, usually 1 does the trick completely. Still on [employer].   Update 04/16/2022 SS: Planning to retire in 2024. Will probably move close to family. Last seizure was in 2005. Remains on Lamictal 200 mg twice daily, Topamax 100 mg twice daily. Headaches under good control, mostly just taking 2 Tylenol. Dorris Singh is 80% effective with tablet. Can go 2 months without needing, other times 2 a week. Never takes all of monthly supply. Has Fiorinal for moderate headaches.    Observations/Objective: Via video visit, is alert and oriented, speech is clear and concise, moves about freely  Assessment and Plan: 1.  Complex partial seizure -Last seizure 2005 2.  Chronic migraine headache  Planning to move in September to [place] state.  Refills are current.  He may need another refill in December.  He will let me know.  I did refill Ubrelvy today.  Works very well for migraines.  He will be establishing with a new neurologist when he relocates.  Follow Up Instructions: As needed   I discussed the assessment and treatment plan with the patient. The patient was provided an opportunity to ask questions and all were  answered. The patient agreed with the plan and demonstrated an understanding of the instructions.   The patient was advised to call back or seek an in-person evaluation if the symptoms worsen or if the condition fails to improve as anticipated.  Otila Kluver, DNP  [facility] [address] [place], [place] [address] [phone]

## 2023-04-19 NOTE — Progress Notes (Unsigned)
      Established patient visit   Patient: Stanley Freeman   DOB: 05-Mar-1961   62 y.o. Male  MRN: [id number] Visit Date: 04/20/2023  Today's healthcare provider: Alfredia Ferguson, PA-C   No chief complaint on file.  Subjective    HPI  ***  Medications: Outpatient Medications Prior to Visit  Medication Sig   albuterol (VENTOLIN HFA) 108 (90 Base) MCG/ACT inhaler Inhale 2 puffs into the lungs every 6 (six) hours as needed for wheezing or shortness of breath.   butalbital-aspirin-caffeine (FIORINAL) 50-325-40 MG capsule Take 2 capsules by mouth 2 (two) times daily as needed for headache.   cetirizine (ZYRTEC) 10 MG tablet Take 10 mg by mouth daily.   clotrimazole-betamethasone (LOTRISONE) cream Apply 1 Application topically 2 (two) times daily.   fluticasone (FLONASE) 50 MCG/ACT nasal spray Place into both nostrils daily.   lamoTRIgine (LAMICTAL) 200 MG tablet Take 1 tablet (200 mg total) by mouth 2 (two) times daily.   montelukast (SINGULAIR) 10 MG tablet TAKE 1 TABLET BY MOUTH AT  BEDTIME   rosuvastatin (CRESTOR) 20 MG tablet TAKE 1 TABLET BY MOUTH DAILY   Testosterone 1.62 % GEL APPLY 3 PUMPS DAILY AS DIRECTED   topiramate (TOPAMAX) 100 MG tablet Take 1 tablet (100 mg total) by mouth 2 (two) times daily.   Ubrogepant (UBRELVY) 50 MG TABS TAKE 1 TABLET BY MOUTH AT ONSET OF MIGRAINE. MAY REPEAT IN 2 HOURS, AS NEEDED. MAX DOSE: 2 TABLETS DAILY. THIS IS A 30 DAY PRESCRIPTION   No facility-administered medications prior to visit.    Review of Systems  {Insert previous labs (optional):23779}  {See past labs  Heme  Chem  Endocrine  Serology  Results Review (optional):1}   Objective    There were no vitals taken for this visit. {Insert last BP/Wt (optional):23777}  {See vitals history (optional):1}  Physical Exam  ***  No results found for any visits on 04/20/23.  Assessment & Plan     ***  No follow-ups on file.      {provider attestation***:1}   Alfredia Ferguson,  PA-C  [facility] [phone] (phone) [phone] (fax)  [facility]

## 2023-04-20 ENCOUNTER — Encounter: Payer: Self-pay | Admitting: Physician Assistant

## 2023-04-20 ENCOUNTER — Ambulatory Visit: Payer: 59 | Admitting: Physician Assistant

## 2023-04-20 VITALS — BP 122/78 | HR 81 | Temp 98.4°F | Resp 20 | Ht 69.0 in | Wt 164.0 lb

## 2023-04-20 DIAGNOSIS — M545 Low back pain, unspecified: Secondary | ICD-10-CM

## 2023-04-20 MED ORDER — CYCLOBENZAPRINE HCL 5 MG PO TABS: 5.0000 mg | ORAL_TABLET | Freq: Three times a day (TID) | ORAL | 0 refills | Status: AC | PRN

## 2023-04-21 ENCOUNTER — Ambulatory Visit: Payer: 59 | Admitting: Neurology

## 2023-08-17 ENCOUNTER — Encounter: Payer: Self-pay | Admitting: Neurology

## 2023-08-17 DIAGNOSIS — R569 Unspecified convulsions: Secondary | ICD-10-CM

## 2023-08-17 MED ORDER — TOPIRAMATE 100 MG PO TABS: 100.0000 mg | ORAL_TABLET | Freq: Two times a day (BID) | ORAL | 1 refills | Status: AC

## 2023-08-17 MED ORDER — LAMOTRIGINE 200 MG PO TABS: 200.0000 mg | ORAL_TABLET | Freq: Two times a day (BID) | ORAL | 1 refills | Status: AC

## 2024-02-21 ENCOUNTER — Other Ambulatory Visit (HOSPITAL_COMMUNITY): Payer: Self-pay
